# Patient Record
Sex: Female | Born: 1970 | Race: Black or African American | Hispanic: No | Marital: Single | Smoking: Former smoker
Health system: Southern US, Community
[De-identification: ages and names within clinical notes are randomized; demographics above are authoritative.]

## PROBLEM LIST (undated history)

## (undated) DIAGNOSIS — J40 Bronchitis, not specified as acute or chronic: Secondary | ICD-10-CM

## (undated) DIAGNOSIS — F419 Anxiety disorder, unspecified: Secondary | ICD-10-CM

## (undated) DIAGNOSIS — F329 Major depressive disorder, single episode, unspecified: Secondary | ICD-10-CM

## (undated) DIAGNOSIS — E785 Hyperlipidemia, unspecified: Secondary | ICD-10-CM

## (undated) DIAGNOSIS — F32A Depression, unspecified: Secondary | ICD-10-CM

## (undated) DIAGNOSIS — D573 Sickle-cell trait: Secondary | ICD-10-CM

## (undated) DIAGNOSIS — R51 Headache: Secondary | ICD-10-CM

## (undated) HISTORY — PX: WISDOM TOOTH EXTRACTION: SHX21

## (undated) HISTORY — PX: OTHER SURGICAL HISTORY: SHX169

## (undated) HISTORY — PX: TONSILLECTOMY: SUR1361

---

## 2003-11-10 ENCOUNTER — Inpatient Hospital Stay (HOSPITAL_COMMUNITY): Admission: AD | Admit: 2003-11-10 | Discharge: 2003-11-10 | Payer: Self-pay | Admitting: Obstetrics & Gynecology

## 2003-11-21 ENCOUNTER — Ambulatory Visit (HOSPITAL_COMMUNITY): Admission: RE | Admit: 2003-11-21 | Discharge: 2003-11-21 | Payer: Self-pay | Admitting: *Deleted

## 2003-12-18 ENCOUNTER — Ambulatory Visit (HOSPITAL_COMMUNITY): Admission: RE | Admit: 2003-12-18 | Discharge: 2003-12-18 | Payer: Self-pay | Admitting: *Deleted

## 2004-01-25 ENCOUNTER — Inpatient Hospital Stay (HOSPITAL_COMMUNITY): Admission: AD | Admit: 2004-01-25 | Discharge: 2004-01-25 | Payer: Self-pay | Admitting: Family Medicine

## 2004-04-16 ENCOUNTER — Inpatient Hospital Stay (HOSPITAL_COMMUNITY): Admission: AD | Admit: 2004-04-16 | Discharge: 2004-04-16 | Payer: Self-pay | Admitting: Obstetrics & Gynecology

## 2004-04-26 ENCOUNTER — Encounter: Admission: RE | Admit: 2004-04-26 | Discharge: 2004-04-26 | Payer: Self-pay | Admitting: Sports Medicine

## 2004-05-19 ENCOUNTER — Inpatient Hospital Stay (HOSPITAL_COMMUNITY): Admission: AD | Admit: 2004-05-19 | Discharge: 2004-05-19 | Payer: Self-pay | Admitting: Obstetrics and Gynecology

## 2004-05-24 ENCOUNTER — Encounter: Admission: RE | Admit: 2004-05-24 | Discharge: 2004-05-24 | Payer: Self-pay | Admitting: *Deleted

## 2004-05-25 ENCOUNTER — Ambulatory Visit: Payer: Self-pay | Admitting: *Deleted

## 2004-05-25 ENCOUNTER — Inpatient Hospital Stay (HOSPITAL_COMMUNITY): Admission: AD | Admit: 2004-05-25 | Discharge: 2004-05-28 | Payer: Self-pay | Admitting: *Deleted

## 2004-11-16 ENCOUNTER — Emergency Department (HOSPITAL_COMMUNITY): Admission: EM | Admit: 2004-11-16 | Discharge: 2004-11-16 | Payer: Self-pay | Admitting: Family Medicine

## 2005-08-19 ENCOUNTER — Ambulatory Visit (HOSPITAL_COMMUNITY): Admission: RE | Admit: 2005-08-19 | Discharge: 2005-08-19 | Payer: Self-pay | Admitting: *Deleted

## 2005-09-24 ENCOUNTER — Emergency Department (HOSPITAL_COMMUNITY): Admission: EM | Admit: 2005-09-24 | Discharge: 2005-09-24 | Payer: Self-pay | Admitting: Emergency Medicine

## 2005-12-21 ENCOUNTER — Emergency Department (HOSPITAL_COMMUNITY): Admission: EM | Admit: 2005-12-21 | Discharge: 2005-12-21 | Payer: Self-pay | Admitting: Family Medicine

## 2006-01-25 ENCOUNTER — Ambulatory Visit: Payer: Self-pay | Admitting: *Deleted

## 2006-01-25 ENCOUNTER — Encounter (INDEPENDENT_AMBULATORY_CARE_PROVIDER_SITE_OTHER): Payer: Self-pay | Admitting: *Deleted

## 2006-02-15 ENCOUNTER — Emergency Department (HOSPITAL_COMMUNITY): Admission: EM | Admit: 2006-02-15 | Discharge: 2006-02-15 | Payer: Self-pay | Admitting: Family Medicine

## 2006-04-30 ENCOUNTER — Emergency Department (HOSPITAL_COMMUNITY): Admission: EM | Admit: 2006-04-30 | Discharge: 2006-04-30 | Payer: Self-pay | Admitting: Emergency Medicine

## 2006-07-25 ENCOUNTER — Emergency Department (HOSPITAL_COMMUNITY): Admission: EM | Admit: 2006-07-25 | Discharge: 2006-07-25 | Payer: Self-pay | Admitting: Family Medicine

## 2006-08-05 ENCOUNTER — Emergency Department (HOSPITAL_COMMUNITY): Admission: EM | Admit: 2006-08-05 | Discharge: 2006-08-05 | Payer: Self-pay | Admitting: Family Medicine

## 2007-08-23 ENCOUNTER — Emergency Department (HOSPITAL_COMMUNITY): Admission: EM | Admit: 2007-08-23 | Discharge: 2007-08-23 | Payer: Self-pay | Admitting: Family Medicine

## 2008-11-20 ENCOUNTER — Emergency Department (HOSPITAL_COMMUNITY): Admission: EM | Admit: 2008-11-20 | Discharge: 2008-11-20 | Payer: Self-pay | Admitting: Family Medicine

## 2008-12-24 ENCOUNTER — Emergency Department (HOSPITAL_COMMUNITY): Admission: EM | Admit: 2008-12-24 | Discharge: 2008-12-24 | Payer: Self-pay | Admitting: Emergency Medicine

## 2009-02-24 ENCOUNTER — Inpatient Hospital Stay (HOSPITAL_COMMUNITY): Admission: AD | Admit: 2009-02-24 | Discharge: 2009-02-24 | Payer: Self-pay | Admitting: Obstetrics & Gynecology

## 2009-05-19 ENCOUNTER — Ambulatory Visit (HOSPITAL_COMMUNITY): Admission: RE | Admit: 2009-05-19 | Discharge: 2009-05-19 | Payer: Self-pay | Admitting: Obstetrics

## 2009-06-30 ENCOUNTER — Ambulatory Visit (HOSPITAL_COMMUNITY): Admission: RE | Admit: 2009-06-30 | Discharge: 2009-06-30 | Payer: Self-pay | Admitting: Obstetrics

## 2009-10-12 ENCOUNTER — Inpatient Hospital Stay (HOSPITAL_COMMUNITY): Admission: AD | Admit: 2009-10-12 | Discharge: 2009-10-14 | Payer: Self-pay | Admitting: Obstetrics & Gynecology

## 2009-11-27 ENCOUNTER — Emergency Department (HOSPITAL_COMMUNITY): Admission: EM | Admit: 2009-11-27 | Discharge: 2009-11-27 | Payer: Self-pay | Admitting: Emergency Medicine

## 2010-09-03 ENCOUNTER — Ambulatory Visit (HOSPITAL_COMMUNITY): Admission: RE | Admit: 2010-09-03 | Discharge: 2010-09-03 | Payer: Self-pay | Admitting: Obstetrics

## 2010-11-17 ENCOUNTER — Other Ambulatory Visit: Payer: Self-pay | Admitting: Obstetrics

## 2010-11-17 DIAGNOSIS — O09529 Supervision of elderly multigravida, unspecified trimester: Secondary | ICD-10-CM

## 2010-11-26 ENCOUNTER — Other Ambulatory Visit (HOSPITAL_COMMUNITY): Payer: Self-pay

## 2010-11-26 ENCOUNTER — Encounter (HOSPITAL_COMMUNITY): Payer: Self-pay

## 2010-11-30 ENCOUNTER — Ambulatory Visit (HOSPITAL_COMMUNITY)
Admission: RE | Admit: 2010-11-30 | Discharge: 2010-11-30 | Disposition: A | Discharge status: Home / Self Care | Payer: Medicaid Other | Source: Ambulatory Visit | Attending: Obstetrics | Admitting: Obstetrics

## 2010-11-30 ENCOUNTER — Other Ambulatory Visit (HOSPITAL_COMMUNITY): Payer: Self-pay

## 2010-11-30 ENCOUNTER — Ambulatory Visit (HOSPITAL_COMMUNITY): Admission: RE | Admit: 2010-11-30 | Payer: Medicaid Other | Source: Ambulatory Visit

## 2010-11-30 DIAGNOSIS — O09529 Supervision of elderly multigravida, unspecified trimester: Secondary | ICD-10-CM | POA: Insufficient documentation

## 2010-11-30 DIAGNOSIS — Z1389 Encounter for screening for other disorder: Secondary | ICD-10-CM | POA: Insufficient documentation

## 2010-11-30 DIAGNOSIS — O358XX0 Maternal care for other (suspected) fetal abnormality and damage, not applicable or unspecified: Secondary | ICD-10-CM | POA: Insufficient documentation

## 2010-11-30 DIAGNOSIS — Z363 Encounter for antenatal screening for malformations: Secondary | ICD-10-CM | POA: Insufficient documentation

## 2010-12-19 LAB — CBC
Hemoglobin: 9.8 g/dL — ABNORMAL LOW (ref 12.0–15.0)
Platelets: 220 10*3/uL (ref 150–400)
RBC: 3.01 MIL/uL — ABNORMAL LOW (ref 3.87–5.11)
RDW: 14.8 % (ref 11.5–15.5)
WBC: 12.1 10*3/uL — ABNORMAL HIGH (ref 4.0–10.5)

## 2010-12-19 LAB — RPR: RPR Ser Ql: NONREACTIVE

## 2011-01-11 LAB — URINALYSIS, ROUTINE W REFLEX MICROSCOPIC
Glucose, UA: NEGATIVE mg/dL
Hgb urine dipstick: NEGATIVE
Specific Gravity, Urine: 1.025 (ref 1.005–1.030)
Urobilinogen, UA: 0.2 mg/dL (ref 0.0–1.0)

## 2011-01-11 LAB — GC/CHLAMYDIA PROBE AMP, GENITAL: Chlamydia, DNA Probe: NEGATIVE

## 2011-01-11 LAB — ABO/RH: ABO/RH(D): A POS

## 2011-01-11 LAB — CBC
MCV: 94.7 fL (ref 78.0–100.0)
RBC: 3.6 MIL/uL — ABNORMAL LOW (ref 3.87–5.11)
WBC: 6.8 10*3/uL (ref 4.0–10.5)

## 2011-01-11 LAB — HCG, QUANTITATIVE, PREGNANCY: hCG, Beta Chain, Quant, S: 85644 m[IU]/mL — ABNORMAL HIGH (ref <5)

## 2011-01-11 LAB — WET PREP, GENITAL: Trich, Wet Prep: NONE SEEN

## 2011-01-13 LAB — POCT URINALYSIS DIP (DEVICE)
Protein, ur: 30 mg/dL — AB
Specific Gravity, Urine: 1.015 (ref 1.005–1.030)
Urobilinogen, UA: 0.2 mg/dL (ref 0.0–1.0)
pH: 5.5 (ref 5.0–8.0)

## 2011-02-18 NOTE — Group Therapy Note (Signed)
NAME:  Shelia Harrison, Shelia Harrison NO.:  [id number]   MEDICAL RECORD NO.:  [id number]          PATIENT TYPE:  WOC   LOCATION:  [facility]                   FACILITY:  [facility]   PHYSICIAN:  Ellis Parents, MD    DATE OF BIRTH:  1970-11-09   DATE OF SERVICE:  01/25/2006                                    CLINIC NOTE   This 40 year old, gravida 2, para 2, last menstrual period January 12, 2006  comes in for contraceptive management. The last menstrual period was scanty  lasting 1-2 days but very minimal blood flow. The previous menstrual period  was early March, the patient has no symptoms of pregnancy. The patient has  been on OC's for 3 years up until October of 2006 when she discontinued the  birth control pills on the advice of family practice physician because of  aching of her thighs. An ultrasound was done which did not identify any deep  venous thrombosis. The patient's periods have been regular. The patient does  not smoke.   PHYSICAL EXAMINATION:  Vagina is clean. The cervix is well epithelized, the  uterus is in mid position but cannot be actively assessed because of the  patient's weight. No pelvic masses are palpable.   Urine pregnancy test was negative. The patient was given a prescription for  Cathren Harsh low for 1 year supply. She is to return in one year.           ______________________________  Ellis Parents, MD     SA/MEDQ  D:  01/25/2006  T:  01/26/2006  Job:  [id number]

## 2011-03-30 ENCOUNTER — Inpatient Hospital Stay (HOSPITAL_COMMUNITY)
Admission: AD | Admit: 2011-03-30 | Discharge: 2011-03-31 | DRG: 780 | Disposition: A | Discharge status: Home / Self Care | Payer: Medicaid Other | Source: Ambulatory Visit | Attending: Obstetrics & Gynecology | Admitting: Obstetrics & Gynecology

## 2011-03-30 DIAGNOSIS — R109 Unspecified abdominal pain: Secondary | ICD-10-CM | POA: Diagnosis present

## 2011-03-30 DIAGNOSIS — O479 False labor, unspecified: Principal | ICD-10-CM | POA: Diagnosis present

## 2011-03-30 LAB — CBC
MCH: 32.4 pg (ref 26.0–34.0)
MCHC: 35.4 g/dL (ref 30.0–36.0)
MCV: 91.5 fL (ref 78.0–100.0)
Platelets: 216 10*3/uL (ref 150–400)
RDW: 14.1 % (ref 11.5–15.5)

## 2011-04-05 ENCOUNTER — Inpatient Hospital Stay (HOSPITAL_COMMUNITY): Payer: Medicaid Other | Admitting: Obstetrics & Gynecology

## 2011-04-05 ENCOUNTER — Inpatient Hospital Stay (HOSPITAL_COMMUNITY)
Admission: AD | Admit: 2011-04-05 | Discharge: 2011-04-07 | DRG: 775 | Disposition: A | Discharge status: Home / Self Care | Payer: Medicaid Other | Source: Ambulatory Visit | Attending: Obstetrics & Gynecology | Admitting: Obstetrics & Gynecology

## 2011-04-05 DIAGNOSIS — Z22330 Carrier of Group B streptococcus: Secondary | ICD-10-CM

## 2011-04-05 DIAGNOSIS — O99892 Other specified diseases and conditions complicating childbirth: Secondary | ICD-10-CM | POA: Diagnosis present

## 2011-04-05 DIAGNOSIS — O09529 Supervision of elderly multigravida, unspecified trimester: Secondary | ICD-10-CM | POA: Diagnosis present

## 2011-04-05 LAB — CBC
Hemoglobin: 11.2 g/dL — ABNORMAL LOW (ref 12.0–15.0)
MCH: 32.1 pg (ref 26.0–34.0)
MCV: 91.1 fL (ref 78.0–100.0)
Platelets: 198 10*3/uL (ref 150–400)
RBC: 3.49 MIL/uL — ABNORMAL LOW (ref 3.87–5.11)
WBC: 14 10*3/uL — ABNORMAL HIGH (ref 4.0–10.5)

## 2011-04-05 LAB — RPR: RPR Ser Ql: NONREACTIVE

## 2011-04-06 LAB — CBC
HCT: 29.4 % — ABNORMAL LOW (ref 36.0–46.0)
Hemoglobin: 10.2 g/dL — ABNORMAL LOW (ref 12.0–15.0)
MCHC: 34.7 g/dL (ref 30.0–36.0)
RBC: 3.2 MIL/uL — ABNORMAL LOW (ref 3.87–5.11)

## 2011-04-09 NOTE — Discharge Summary (Signed)
Shelia Harrison, BELMARES NO.:  [id number]  MEDICAL RECORD NO.:  [id number]  LOCATION:  9169                          FACILITY:  [facility]  PHYSICIAN:  Juanitta Earnhardt A. Clearance Coots, M.D.DATE OF BIRTH:  Oct 12, 1970  DATE OF ADMISSION:  03/30/2011 DATE OF DISCHARGE:  03/31/2011                              DISCHARGE SUMMARY   ADMITTING DIAGNOSES: 1. Multipara at [redacted] weeks gestation. 2. Latent phase of labor. 3. Rule out labor.  DISCHARGE DIAGNOSES: 1. Multipara at [redacted] weeks gestation. 2. Latent phase of labor. 3. Rule out labor.  DISCHARGE HOME:  Undelivered at [redacted] weeks gestation in good condition, not in labor.  REASON FOR ADMISSION:  A 40 year old para 3, estimated date of confinement of April 10, 2011, presents with uterine contractions.  PAST MEDICAL HISTORY:  Please see chart for full historical data.  PHYSICAL EXAMINATION:  GENERAL:  Well-nourished and well-developed female, in no acute distress. VITAL SIGNS:  Afebrile.  Stable. LUNGS:  Clear to auscultation bilaterally. HEART:  Regular rate and rhythm. ABDOMEN:  Gravid, nontender.  Cervix per nurses' exam was 5 cm and 80% effaced, vertex at a -3 station.  ADMITTING LABORATORY DATA:  Hemoglobin 12.5, hematocrit 35, white blood cell count 17,100, platelets 116,000.  RPR was nonreactive.  Group B strep was positive.  HOSPITAL COURSE:  The patient was admitted to Labor and Delivery for rule out labor.  She received pain medication during the course of her uterine contractions and her contractions ceased.  She was discharged home the following morning with occasional mild uterine contractions. Cervix was unchanged.  The patient was discharged home with instructions for labor.  DISCHARGE DISPOSITION:  Medications:  Continue prenatal vitamins. Routine instructions were given for labor.  The patient is to keep regular scheduled appointment in the office.     Erin Obando A. Clearance Coots, M.D.     CAH/MEDQ  D:  04/08/2011  T:   04/09/2011  Job:  [id number]

## 2011-06-23 ENCOUNTER — Encounter (HOSPITAL_COMMUNITY): Payer: Self-pay | Admitting: *Deleted

## 2011-06-23 ENCOUNTER — Other Ambulatory Visit: Payer: Self-pay | Admitting: Obstetrics & Gynecology

## 2011-07-08 ENCOUNTER — Encounter (HOSPITAL_COMMUNITY)
Admission: RE | Disposition: A | Discharge status: Home / Self Care | Payer: Self-pay | Source: Ambulatory Visit | Attending: Obstetrics & Gynecology

## 2011-07-08 ENCOUNTER — Encounter (HOSPITAL_COMMUNITY): Payer: Self-pay | Admitting: Certified Registered"

## 2011-07-08 ENCOUNTER — Encounter (HOSPITAL_COMMUNITY): Payer: Self-pay | Admitting: Obstetrics & Gynecology

## 2011-07-08 ENCOUNTER — Ambulatory Visit (HOSPITAL_COMMUNITY)
Admission: RE | Admit: 2011-07-08 | Discharge: 2011-07-08 | Disposition: A | Discharge status: Home / Self Care | Payer: Medicaid Other | Source: Ambulatory Visit | Attending: Obstetrics & Gynecology | Admitting: Obstetrics & Gynecology

## 2011-07-08 ENCOUNTER — Ambulatory Visit (HOSPITAL_COMMUNITY): Payer: Medicaid Other | Admitting: Certified Registered"

## 2011-07-08 ENCOUNTER — Encounter (HOSPITAL_COMMUNITY): Payer: Self-pay | Admitting: *Deleted

## 2011-07-08 DIAGNOSIS — Z302 Encounter for sterilization: Secondary | ICD-10-CM | POA: Insufficient documentation

## 2011-07-08 DIAGNOSIS — Z309 Encounter for contraceptive management, unspecified: Secondary | ICD-10-CM

## 2011-07-08 DIAGNOSIS — Z5320 Procedure and treatment not carried out because of patient's decision for unspecified reasons: Secondary | ICD-10-CM | POA: Insufficient documentation

## 2011-07-08 HISTORY — DX: Major depressive disorder, single episode, unspecified: F32.9

## 2011-07-08 HISTORY — DX: Bronchitis, not specified as acute or chronic: J40

## 2011-07-08 HISTORY — DX: Anxiety disorder, unspecified: F41.9

## 2011-07-08 HISTORY — DX: Hyperlipidemia, unspecified: E78.5

## 2011-07-08 HISTORY — DX: Depression, unspecified: F32.A

## 2011-07-08 HISTORY — DX: Headache: R51

## 2011-07-08 LAB — CBC
HCT: 35.1 % — ABNORMAL LOW (ref 36.0–46.0)
MCH: 31 pg (ref 26.0–34.0)
MCV: 89.1 fL (ref 78.0–100.0)
Platelets: 269 10*3/uL (ref 150–400)
RBC: 3.94 MIL/uL (ref 3.87–5.11)
WBC: 5.2 10*3/uL (ref 4.0–10.5)

## 2011-07-08 SURGERY — ESSURE TUBAL STERILIZATION

## 2011-07-08 MED ORDER — GLYCOPYRROLATE 0.2 MG/ML IJ SOLN
INTRAMUSCULAR | Status: AC
  Filled 2011-07-08: qty 1

## 2011-07-08 MED ORDER — LACTATED RINGERS IV SOLN
INTRAVENOUS | Status: DC
  Administered 2011-07-08: 14:00:00 via INTRAVENOUS

## 2011-07-08 MED ORDER — FENTANYL CITRATE 0.05 MG/ML IJ SOLN
INTRAMUSCULAR | Status: AC
  Filled 2011-07-08: qty 2

## 2011-07-08 MED ORDER — ONDANSETRON HCL 4 MG/2ML IJ SOLN
INTRAMUSCULAR | Status: AC
  Filled 2011-07-08: qty 2

## 2011-07-08 MED ORDER — FAMOTIDINE 20 MG PO TABS: 20.0000 mg | ORAL_TABLET | Freq: Once | ORAL | Status: DC

## 2011-07-08 MED ORDER — LIDOCAINE HCL (CARDIAC) 20 MG/ML IV SOLN
INTRAVENOUS | Status: AC
  Filled 2011-07-08: qty 5

## 2011-07-08 MED ORDER — MIDAZOLAM HCL 5 MG/5ML IJ SOLN
INTRAMUSCULAR | Status: DC | PRN
  Administered 2011-07-08: 2 mg via INTRAVENOUS

## 2011-07-08 MED ORDER — MIDAZOLAM HCL 2 MG/2ML IJ SOLN
INTRAMUSCULAR | Status: AC
  Filled 2011-07-08: qty 2

## 2011-07-08 MED ORDER — PANTOPRAZOLE SODIUM 40 MG PO TBEC: 40.0000 mg | DELAYED_RELEASE_TABLET | Freq: Once | ORAL | Status: DC

## 2011-07-08 MED ORDER — PROPOFOL 10 MG/ML IV EMUL
INTRAVENOUS | Status: AC
  Filled 2011-07-08: qty 20

## 2011-07-08 MED ORDER — SCOPOLAMINE 1 MG/3DAYS TD PT72: 1.0000 | MEDICATED_PATCH | Freq: Once | TRANSDERMAL | Status: DC

## 2011-07-08 MED ORDER — METOCLOPRAMIDE HCL 10 MG PO TABS: 10.0000 mg | ORAL_TABLET | Freq: Once | ORAL | Status: DC

## 2011-07-08 MED ORDER — SODIUM CHLORIDE 0.9 % IR SOLN: Status: DC | PRN

## 2011-07-08 MED ORDER — DEXAMETHASONE SODIUM PHOSPHATE 10 MG/ML IJ SOLN
INTRAMUSCULAR | Status: AC
  Filled 2011-07-08: qty 1

## 2011-07-08 MED ORDER — CITRIC ACID-SODIUM CITRATE 334-500 MG/5ML PO SOLN: 30.0000 mL | Freq: Once | ORAL | Status: DC

## 2011-07-08 SURGICAL SUPPLY — 9 items
CATH ROBINSON RED A/P 16FR (CATHETERS) ×3 IMPLANT
CLOTH BEACON ORANGE TIMEOUT ST (SAFETY) ×3 IMPLANT
CONTAINER PREFILL 10% NBF 60ML (FORM) IMPLANT
DRAPE UTILITY XL STRL (DRAPES) ×3 IMPLANT
GLOVE BIO SURGEON STRL SZ 6.5 (GLOVE) ×6 IMPLANT
GOWN PREVENTION PLUS LG XLONG (DISPOSABLE) ×6 IMPLANT
PACK HYSTEROSCOPY LF (CUSTOM PROCEDURE TRAY) ×3 IMPLANT
TOWEL OR 17X24 6PK STRL BLUE (TOWEL DISPOSABLE) ×6 IMPLANT
WATER STERILE IRR 1000ML POUR (IV SOLUTION) ×3 IMPLANT

## 2011-07-08 NOTE — H&P (Signed)
  Chief Complaint: 40 y.o. who presents for an Essure procedure  Details of Present Illness: See above.  LMP 06/19/2011  Past Medical History  Diagnosis Date  . Hyperlipidemia     no meds  . Bronchitis   . Headache   . Anxiety     no meds  . Depression     no meds   History   Social History  . Marital Status: Single    Spouse Name: N/A    Number of Children: N/A  . Years of Education: N/A   Occupational History  . Not on file.   Social History Main Topics  . Smoking status: Former Smoker    Types: Cigars    Quit date: 10/03/2008  . Smokeless tobacco: Never Used  . Alcohol Use: 0.5 oz/week    1 drink(s) per week     occasionally  . Drug Use: No  . Sexually Active: Not Currently   Other Topics Concern  . Not on file   Social History Narrative  . No narrative on file   History reviewed. No pertinent family history.  Pertinent items are noted in HPI.  Pre-Op Diagnosis: desires sterilization   Planned Procedure: Procedure(s): ESSURE TUBAL STERILIZATION  I have reviewed the patient's history and have completed the physical exam and Shelia Harrison is acceptable for surgery.  Roseanna Rainbow, MD 07/08/2011 9:23 AM

## 2011-07-08 NOTE — Anesthesia Postprocedure Evaluation (Signed)
  Anesthesia Post-op Note  Patient: Shelia Harrison  Procedure(s) Performed:  ESSURE TUBAL STERILIZATION  Patient decided to cancel surgery after she was taken to the OR. She had been given Versed 2mg  IV. This was the only medication administered in the OR. She was taken to the PACU.   Post assessment: Post-op Vital signs reviewed  Last Vitals:  Filed Vitals:   07/08/11 1455  BP:   Pulse: 71  Temp:   Resp: 16    Post vital signs: Reviewed and stable  Level of consciousness: sedated   Complications: No apparent anesthesia complications

## 2011-07-08 NOTE — OR Nursing (Signed)
Pt transferred to OR room #8 assisted on to OR table, Pt then stated,  " I changed my mind, I don't want to have procedure done" Dr. Tamela Oddi notified and called to room to speak with patient. Surgery canceled as per Tamela Oddi MD. Pt transfer to PACU for monitoring

## 2011-07-08 NOTE — Brief Op Note (Signed)
07/08/2011  2:00 PM  PATIENT:  Shelia Harrison  39 y.o. female  PRE-OPERATIVE DIAGNOSIS:  desires sterilization  POST-OPERATIVE DIAGNOSIS:  Pt changed her mind.  PROCEDURE:  Procedure(s): ESSURE TUBAL STERILIZATION  SURGEON:  Surgeon(s): Roseanna Rainbow, MD     ANESTHESIA:   IV sedation  OR FLUID I/O:  Total I/O In: 200 [I.V.:200] Out: -   BLOOD ADMINISTERED:none  DRAINS: none   LOCAL MEDICATIONS USED:  NONE  SPECIMEN:  No Specimen  DISPOSITION OF SPECIMEN:  N/A     PLAN OF CARE: Discharge to home after PACU  PATIENT DISPOSITION:  PACU - hemodynamically stable.   The procedure was aborted per pt request.

## 2011-07-08 NOTE — Anesthesia Preprocedure Evaluation (Signed)
Anesthesia Evaluation  Name, MR# and DOB Patient awake  General Assessment Comment  Reviewed: Allergy & Precautions, H&P , NPO status , Patient's Chart, lab work & pertinent test results  Airway Mallampati: II TM Distance: >3 FB Neck ROM: full    Dental No notable dental hx. (+) Teeth Intact   Pulmonary  clear to auscultation  Pulmonary exam normal       Cardiovascular regular Normal    Neuro/Psych Negative Neurological ROS  Negative Psych ROS   GI/Hepatic negative GI ROS Neg liver ROS    Endo/Other  Negative Endocrine ROS  Renal/GU negative Renal ROS  Genitourinary negative   Musculoskeletal   Abdominal   Peds  Hematology negative hematology ROS (+)   Anesthesia Other Findings   Reproductive/Obstetrics negative OB ROS                           Anesthesia Physical Anesthesia Plan  ASA: II  Anesthesia Plan: General   Post-op Pain Management:    Induction:   Airway Management Planned:   Additional Equipment:   Intra-op Plan:   Post-operative Plan:   Informed Consent: I have reviewed the patients History and Physical, chart, labs and discussed the procedure including the risks, benefits and alternatives for the proposed anesthesia with the patient or authorized representative who has indicated his/her understanding and acceptance.   Dental Advisory Given  Plan Discussed with: Anesthesiologist  Anesthesia Plan Comments:         Anesthesia Quick Evaluation

## 2011-07-08 NOTE — Transfer of Care (Signed)
Immediate Anesthesia Transfer of Care Note  Patient: Shelia Harrison  Procedure(s) Performed:  ESSURE TUBAL STERILIZATION  Patient Location: PACU  Anesthesia Type: General  Level of Consciousness: awake, alert  and oriented  Airway & Oxygen Therapy: Patient Spontanous Breathing  Post-op Assessment: Report given to PACU RN and Post -op Vital signs reviewed and stable  Post vital signs: Reviewed and stable  Complications: No apparent anesthesia complications

## 2011-07-12 LAB — URINE CULTURE: Colony Count: >=100000

## 2011-07-12 LAB — POCT URINALYSIS DIP (DEVICE)
Bilirubin Urine: NEGATIVE
Ketones, ur: 15 — AB
Operator id: 239701
Protein, ur: 30 — AB
Specific Gravity, Urine: 1.015

## 2012-11-05 ENCOUNTER — Encounter (HOSPITAL_COMMUNITY): Payer: Self-pay

## 2012-11-05 ENCOUNTER — Emergency Department (INDEPENDENT_AMBULATORY_CARE_PROVIDER_SITE_OTHER)
Admission: EM | Admit: 2012-11-05 | Discharge: 2012-11-05 | Disposition: A | Discharge status: Home / Self Care | Payer: Medicaid Other | Source: Home / Self Care | Attending: Family Medicine | Admitting: Family Medicine

## 2012-11-05 DIAGNOSIS — Z041 Encounter for examination and observation following transport accident: Secondary | ICD-10-CM

## 2012-11-05 DIAGNOSIS — M549 Dorsalgia, unspecified: Secondary | ICD-10-CM

## 2012-11-05 DIAGNOSIS — M542 Cervicalgia: Secondary | ICD-10-CM

## 2012-11-05 HISTORY — DX: Sickle-cell trait: D57.3

## 2012-11-05 MED ORDER — CYCLOBENZAPRINE HCL 5 MG PO TABS: 5.0000 mg | ORAL_TABLET | Freq: Three times a day (TID) | ORAL | Status: DC | PRN

## 2012-11-05 MED ORDER — IBUPROFEN 800 MG PO TABS: 800.0000 mg | ORAL_TABLET | Freq: Three times a day (TID) | ORAL | Status: DC
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
## 2013-04-24 ENCOUNTER — Other Ambulatory Visit: Payer: Self-pay | Admitting: Family Medicine

## 2013-04-24 DIAGNOSIS — Z1231 Encounter for screening mammogram for malignant neoplasm of breast: Secondary | ICD-10-CM

## 2013-05-23 ENCOUNTER — Ambulatory Visit: Payer: Medicaid Other

## 2013-06-18 ENCOUNTER — Ambulatory Visit
Admission: RE | Admit: 2013-06-18 | Discharge: 2013-06-18 | Disposition: A | Discharge status: Home / Self Care | Payer: Medicaid Other | Source: Ambulatory Visit | Attending: Family Medicine | Admitting: Family Medicine

## 2013-06-18 DIAGNOSIS — Z1231 Encounter for screening mammogram for malignant neoplasm of breast: Secondary | ICD-10-CM

## 2014-01-10 ENCOUNTER — Emergency Department (HOSPITAL_COMMUNITY): Admission: EM | Admit: 2014-01-10 | Discharge: 2014-01-10 | Payer: Self-pay

## 2014-01-10 ENCOUNTER — Encounter (HOSPITAL_COMMUNITY): Payer: Self-pay | Admitting: Emergency Medicine

## 2014-01-10 ENCOUNTER — Emergency Department (INDEPENDENT_AMBULATORY_CARE_PROVIDER_SITE_OTHER)
Admission: EM | Admit: 2014-01-10 | Discharge: 2014-01-10 | Disposition: A | Discharge status: Home / Self Care | Payer: Medicaid Other | Source: Home / Self Care | Attending: Family Medicine | Admitting: Family Medicine

## 2014-01-10 DIAGNOSIS — IMO0002 Reserved for concepts with insufficient information to code with codable children: Secondary | ICD-10-CM

## 2014-01-10 DIAGNOSIS — T148XXA Other injury of unspecified body region, initial encounter: Secondary | ICD-10-CM

## 2014-01-10 NOTE — ED Notes (Signed)
Pt states that while bending over at work hit left side of head on corner of shelf.  C/o tenderness to touch.  Mild swelling.   Incident happened last night.

## 2014-01-10 NOTE — ED Provider Notes (Signed)
Medical screening examination/treatment/procedure(s) were performed by resident physician or non-physician practitioner and as supervising physician I was immediately available for consultation/collaboration.   Kennetha Pearman DOUGLAS MD.   Taliyah Watrous D Burle Kwan, MD 01/10/14 1301 

## 2014-01-10 NOTE — Discharge Instructions (Signed)
Use warm compresses several times a day to help reduce the swelling and pain. Use ibuprofen or Aleve as directed on the package to help with the pain.    Contusion A contusion is a deep bruise. Contusions happen when an injury causes bleeding under the skin. Signs of bruising include pain, puffiness (swelling), and discolored skin. The contusion may turn blue, purple, or yellow. HOME CARE   Put ice on the injured area.  Put ice in a plastic bag.  Place a towel between your skin and the bag.  Leave the ice on for 15-20 minutes, 03-04 times a day.  Only take medicine as told by your doctor.  Rest the injured area.  If possible, raise (elevate) the injured area to lessen puffiness. GET HELP RIGHT AWAY IF:   You have more bruising or puffiness.  You have pain that is getting worse.  Your puffiness or pain is not helped by medicine. MAKE SURE YOU:   Understand these instructions.  Will watch your condition.  Will get help right away if you are not doing well or get worse. Document Released: 03/07/2008 Document Revised: 12/12/2011 Document Reviewed: 07/25/2011 [facility] Patient Information 2014 [place], [place].

## 2014-01-10 NOTE — ED Provider Notes (Signed)
CSN: [id number]     Arrival date & time 01/10/14  [id number] History   First MD Initiated Contact with Patient 01/10/14 1031     Chief Complaint  Patient presents with  . Head Injury   (Consider location/radiation/quality/duration/timing/severity/associated sxs/prior Treatment) HPI Comments: Injury didn't bother pt until yesterday  Patient is a 43 y.o. female presenting with head injury. The history is provided by the patient.  Head Injury Location:  L parietal Time since incident:  2 days Mechanism of injury comment:  When standing up after bending over, hit head on corner of steel shelf Pain details:    Quality:  Aching   Severity:  Mild   Duration:  2 days   Timing:  Constant   Progression:  Improving Chronicity:  New Relieved by:  None tried Worsened by:  Pressure Ineffective treatments:  None tried Associated symptoms: blurred vision   Associated symptoms: no headaches and no loss of consciousness     Past Medical History  Diagnosis Date  . Hyperlipidemia     no meds  . Bronchitis   . Headache(784.0)   . Anxiety     no meds  . Depression     no meds  . Sickle cell trait    Past Surgical History  Procedure Laterality Date  . Svd       x 4  . Tonsillectomy    . Wisdom tooth extraction    . Hyperlipidemia      no meds   History reviewed. No pertinent family history. History  Substance Use Topics  . Smoking status: Former Smoker    Types: Cigars    Quit date: 10/03/2008  . Smokeless tobacco: Never Used  . Alcohol Use: 0.5 oz/week    1 drink(s) per week     Comment: occasionally   OB History   Grav Para Term Preterm Abortions TAB SAB Ect Mult Living                 Review of Systems  HENT:       Minor head injury  Eyes: Positive for blurred vision.  Skin: Negative for wound.  Neurological: Negative for loss of consciousness and headaches.  Psychiatric/Behavioral: Negative for confusion.    Allergies  Review of patient's allergies indicates no  known allergies.  Home Medications   Current Outpatient Rx  Name  Route  Sig  Dispense  Refill  . cyclobenzaprine (FLEXERIL) 5 MG tablet   Oral   Take 1 tablet (5 mg total) by mouth 3 (three) times daily as needed for muscle spasms.   30 tablet   0   . HYDROcodone-acetaminophen (VICODIN ES) 7.5-750 MG per tablet   Oral   Take 0.5 tablets by mouth every 6 (six) hours as needed. Patient takes for pain          . ibuprofen (ADVIL,MOTRIN) 800 MG tablet   Oral   Take 1 tablet (800 mg total) by mouth 3 (three) times daily.   30 tablet   0   . prenatal vitamin w/FE, FA (PRENATAL 1 + 1) 27-1 MG TABS   Oral   Take 1 tablet by mouth daily.            BP 127/85  Pulse 67  Temp(Src) 98.4 F (36.9 C) (Oral)  Resp 18  SpO2 100%  LMP 01/09/2014 Physical Exam  Constitutional: She is oriented to person, place, and time. She appears well-developed and well-nourished. No distress.  Pulmonary/Chest: Effort normal.  Neurological:  She is alert and oriented to person, place, and time.  Skin: Skin is warm, dry and intact. No bruising noted. No erythema.  Small 1cm nodule L parietal scalp is tender to palp    ED Course  Procedures (including critical care time) Labs Review Labs Reviewed - No data to display Imaging Review No results found.   MDM   1. Contusion   very minor head injury with assoc soft tissue swelling. Suggested pt use warm compresses and ibuprofen.      Cathlyn Parsons, NP 01/10/14 1039

## 2014-03-14 ENCOUNTER — Encounter (HOSPITAL_COMMUNITY): Payer: Self-pay | Admitting: Emergency Medicine

## 2014-03-14 ENCOUNTER — Emergency Department (INDEPENDENT_AMBULATORY_CARE_PROVIDER_SITE_OTHER)
Admission: EM | Admit: 2014-03-14 | Discharge: 2014-03-14 | Disposition: A | Discharge status: Home / Self Care | Payer: Medicaid Other | Source: Home / Self Care

## 2014-03-14 DIAGNOSIS — A084 Viral intestinal infection, unspecified: Secondary | ICD-10-CM

## 2014-03-14 DIAGNOSIS — A088 Other specified intestinal infections: Secondary | ICD-10-CM

## 2014-03-14 MED ORDER — ONDANSETRON 4 MG PO TBDP: 4.0000 mg | ORAL_TABLET | Freq: Three times a day (TID) | ORAL | Status: DC | PRN

## 2014-03-14 NOTE — ED Notes (Signed)
Patient complains of pain in middle to lower abdomin at and around "belly button"; states pain started this morning; states some nausea denies vomiting.

## 2014-03-14 NOTE — ED Provider Notes (Signed)
CSN: [id number]     Arrival date & time 03/14/14  1232 History   None    Chief Complaint  Patient presents with  . Abdominal Pain   (Consider location/radiation/quality/duration/timing/severity/associated sxs/prior Treatment) Patient is a 43 y.o. female presenting with abdominal pain.  Abdominal Pain   Abd pain: onset this am. Getting worse. Sharp. Near the umbilicus. Comes and goes. Feeling lightehaded and nauseaus. Denies fevers, hematochezia, hematemes, HA, dysuria, frequency, CP, palpitations, diarrhea. Soft BMs BID. Daughter w/ similar symptoms 7 days ago. Sexually active. Started menstrual period today.    Past Medical History  Diagnosis Date  . Hyperlipidemia     no meds  . Bronchitis   . Headache(784.0)   . Anxiety     no meds  . Depression     no meds  . Sickle cell trait    Past Surgical History  Procedure Laterality Date  . Svd       x 4  . Tonsillectomy    . Wisdom tooth extraction    . Hyperlipidemia      no meds   No family history on file. History  Substance Use Topics  . Smoking status: Former Smoker    Types: Cigars    Quit date: 10/03/2008  . Smokeless tobacco: Never Used  . Alcohol Use: 0.5 oz/week    1 drink(s) per week     Comment: occasionally   OB History   Grav Para Term Preterm Abortions TAB SAB Ect Mult Living                 Review of Systems  Gastrointestinal: Positive for abdominal pain.   Per hpi w/ all other systems negative Allergies  Review of patient's allergies indicates no known allergies.  Home Medications   Prior to Admission medications   Medication Sig Start Date End Date Taking? Authorizing Provider  norgestimate-ethinyl estradiol (ORTHO-CYCLEN,SPRINTEC,PREVIFEM) 0.25-35 MG-MCG tablet Take 1 tablet by mouth daily.   Yes Historical Provider, MD  cyclobenzaprine (FLEXERIL) 5 MG tablet Take 1 tablet (5 mg total) by mouth 3 (three) times daily as needed for muscle spasms. 11/05/12   Linna HoffJames D Kindl, MD   HYDROcodone-acetaminophen (VICODIN ES) 7.5-750 MG per tablet Take 0.5 tablets by mouth every 6 (six) hours as needed. Patient takes for pain     Historical Provider, MD  ibuprofen (ADVIL,MOTRIN) 800 MG tablet Take 1 tablet (800 mg total) by mouth 3 (three) times daily. 11/05/12   Linna HoffJames D Kindl, MD  ondansetron (ZOFRAN-ODT) 4 MG disintegrating tablet Take 1 tablet (4 mg total) by mouth every 8 (eight) hours as needed for nausea or vomiting. 03/14/14   Ozella Rocksavid J Annalyssa Thune, MD  prenatal vitamin w/FE, FA (PRENATAL 1 + 1) 27-1 MG TABS Take 1 tablet by mouth daily.      Historical Provider, MD   BP 114/79  Pulse 69  Temp(Src) 98.5 F (36.9 C) (Oral)  Resp 20  SpO2 100%  LMP 03/14/2014 Physical Exam  Constitutional: She is oriented to person, place, and time. She appears well-developed and well-nourished. No distress.  HENT:  Head: Normocephalic and atraumatic.  Eyes: EOM are normal. Pupils are equal, round, and reactive to light.  Neck: Normal range of motion.  Cardiovascular: Normal rate, normal heart sounds and intact distal pulses.   No murmur heard. Pulmonary/Chest: Effort normal. No respiratory distress.  Abdominal: Soft. Bowel sounds are normal. She exhibits no distension and no mass. There is no tenderness. There is no rebound and no guarding.  No pain at mcburny's point and negative murphy;s sign  Musculoskeletal: Normal range of motion. She exhibits no edema and no tenderness.  Neurological: She is alert and oriented to person, place, and time.  Skin: Skin is warm. No rash noted.  Psychiatric: She has a normal mood and affect. Her behavior is normal. Judgment and thought content normal.    ED Course  Procedures (including critical care time) Labs Review Labs Reviewed - No data to display  Imaging Review No results found.   MDM   1. Viral gastroenteritis    Fluids, rest, electolytes disussed. Disease spread prevention discussed. Pt given Rx for Zofran.   Shelly Flattenavid Nakaila Freeze,  MD Family Medicine PGY-3 03/14/2014, 2:02 PM      Ozella Rocksavid J Elpidio Thielen, MD 03/14/14 [phone]

## 2014-03-14 NOTE — ED Provider Notes (Signed)
Medical screening examination/treatment/procedure(s) were performed by a resident physician and as supervising physician I was immediately available for consultation/collaboration.  Leslee Homeavid Siah Kannan, M.D.  Reuben Likesavid C Elizabethanne Lusher, MD 03/14/14 2151

## 2014-03-14 NOTE — Discharge Instructions (Signed)
You have viral gastroenteritis. This will take anywherer from 24hrs to 10 days to clear Stay well hydrated. Take tylenol and ibuprofen for fever.  Eat bland foods until your appetite returns  Gastritis, Adult Gastritis is soreness and swelling (inflammation) of the lining of the stomach. Gastritis can develop as a sudden onset (acute) or long-term (chronic) condition. If gastritis is not treated, it can lead to stomach bleeding and ulcers. CAUSES  Gastritis occurs when the stomach lining is weak or damaged. Digestive juices from the stomach then inflame the weakened stomach lining. The stomach lining may be weak or damaged due to viral or bacterial infections. One common bacterial infection is the Helicobacter pylori infection. Gastritis can also result from excessive alcohol consumption, taking certain medicines, or having too much acid in the stomach.  SYMPTOMS  In some cases, there are no symptoms. When symptoms are present, they may include:  Pain or a burning sensation in the upper abdomen.  Nausea.  Vomiting.  An uncomfortable feeling of fullness after eating. DIAGNOSIS  Your caregiver may suspect you have gastritis based on your symptoms and a physical exam. To determine the cause of your gastritis, your caregiver may perform the following:  Blood or stool tests to check for the H pylori bacterium.  Gastroscopy. A thin, flexible tube (endoscope) is passed down the esophagus and into the stomach. The endoscope has a light and camera on the end. Your caregiver uses the endoscope to view the inside of the stomach.  Taking a tissue sample (biopsy) from the stomach to examine under a microscope. TREATMENT  Depending on the cause of your gastritis, medicines may be prescribed. If you have a bacterial infection, such as an H pylori infection, antibiotics may be given. If your gastritis is caused by too much acid in the stomach, H2 blockers or antacids may be given. Your caregiver may  recommend that you stop taking aspirin, ibuprofen, or other nonsteroidal anti-inflammatory drugs (NSAIDs). HOME CARE INSTRUCTIONS  Only take over-the-counter or prescription medicines as directed by your caregiver.  If you were given antibiotic medicines, take them as directed. Finish them even if you start to feel better.  Drink enough fluids to keep your urine clear or pale yellow.  Avoid foods and drinks that make your symptoms worse, such as:  Caffeine or alcoholic drinks.  Chocolate.  Peppermint or mint flavorings.  Garlic and onions.  Spicy foods.  Citrus fruits, such as oranges, lemons, or limes.  Tomato-based foods such as sauce, chili, salsa, and pizza.  Fried and fatty foods.  Eat small, frequent meals instead of large meals. SEEK IMMEDIATE MEDICAL CARE IF:   You have black or dark red stools.  You vomit blood or material that looks like coffee grounds.  You are unable to keep fluids down.  Your abdominal pain gets worse.  You have a fever.  You do not feel better after 1 week.  You have any other questions or concerns. MAKE SURE YOU:  Understand these instructions.  Will watch your condition.  Will get help right away if you are not doing well or get worse. Document Released: 09/13/2001 Document Revised: 03/20/2012 Document Reviewed: 11/02/2011 [facility] Patient Information 2014 [place], [place].

## 2014-08-21 ENCOUNTER — Emergency Department (INDEPENDENT_AMBULATORY_CARE_PROVIDER_SITE_OTHER): Payer: Medicaid Other

## 2014-08-21 ENCOUNTER — Emergency Department (INDEPENDENT_AMBULATORY_CARE_PROVIDER_SITE_OTHER)
Admission: EM | Admit: 2014-08-21 | Discharge: 2014-08-21 | Disposition: A | Discharge status: Home / Self Care | Payer: Medicaid Other | Source: Home / Self Care | Attending: Emergency Medicine | Admitting: Emergency Medicine

## 2014-08-21 ENCOUNTER — Encounter (HOSPITAL_COMMUNITY): Payer: Self-pay

## 2014-08-21 DIAGNOSIS — M722 Plantar fascial fibromatosis: Secondary | ICD-10-CM | POA: Diagnosis not present

## 2014-08-21 MED ORDER — MELOXICAM 15 MG PO TABS: 15.0000 mg | ORAL_TABLET | Freq: Every day | ORAL | Status: DC

## 2014-08-21 MED ORDER — TRAMADOL HCL 50 MG PO TABS: 50.0000 mg | ORAL_TABLET | Freq: Four times a day (QID) | ORAL | Status: DC | PRN

## 2014-08-21 NOTE — Discharge Instructions (Signed)
Plantar Fasciitis (Heel Spur Syndrome) with Rehab The plantar fascia is a fibrous, ligament-like, soft-tissue structure that spans the bottom of the foot. Plantar fasciitis is a condition that causes pain in the foot due to inflammation of the tissue. SYMPTOMS   Pain and tenderness on the underneath side of the foot.  Pain that worsens with standing or walking. CAUSES  Plantar fasciitis is caused by irritation and injury to the plantar fascia on the underneath side of the foot. Common mechanisms of injury include:  Direct trauma to bottom of the foot.  Damage to a small nerve that runs under the foot where the main fascia attaches to the heel bone.  Stress placed on the plantar fascia due to bone spurs. RISK INCREASES WITH:   Activities that place stress on the plantar fascia (running, jumping, pivoting, or cutting).  Poor strength and flexibility.  Improperly fitted shoes.  Tight calf muscles.  Flat feet.  Failure to warm-up properly before activity.  Obesity. PREVENTION  Warm up and stretch properly before activity.  Allow for adequate recovery between workouts.  Maintain physical fitness:  Strength, flexibility, and endurance.  Cardiovascular fitness.  Maintain a health body weight.  Avoid stress on the plantar fascia.  Wear properly fitted shoes, including arch supports for individuals who have flat feet. PROGNOSIS  If treated properly, then the symptoms of plantar fasciitis usually resolve without surgery. However, occasionally surgery is necessary. RELATED COMPLICATIONS   Recurrent symptoms that may result in a chronic condition.  Problems of the lower back that are caused by compensating for the injury, such as limping.  Pain or weakness of the foot during push-off following surgery.  Chronic inflammation, scarring, and partial or complete fascia tear, occurring more often from repeated injections. TREATMENT  Treatment initially involves the use of  ice and medication to help reduce pain and inflammation. The use of strengthening and stretching exercises may help reduce pain with activity, especially stretches of the Achilles tendon. These exercises may be performed at home or with a therapist. Your caregiver may recommend that you use heel cups of arch supports to help reduce stress on the plantar fascia. Occasionally, corticosteroid injections are given to reduce inflammation. If symptoms persist for greater than 6 months despite non-surgical (conservative), then surgery may be recommended.  MEDICATION   If pain medication is necessary, then nonsteroidal anti-inflammatory medications, such as aspirin and ibuprofen, or other minor pain relievers, such as acetaminophen, are often recommended.  Do not take pain medication within 7 days before surgery.  Prescription pain relievers may be given if deemed necessary by your caregiver. Use only as directed and only as much as you need.  Corticosteroid injections may be given by your caregiver. These injections should be reserved for the most serious cases, because they may only be given a certain number of times. HEAT AND COLD  Cold treatment (icing) relieves pain and reduces inflammation. Cold treatment should be applied for 10 to 15 minutes every 2 to 3 hours for inflammation and pain and immediately after any activity that aggravates your symptoms. Use ice packs or massage the area with a piece of ice (ice massage).  Heat treatment may be used prior to performing the stretching and strengthening activities prescribed by your caregiver, physical therapist, or athletic trainer. Use a heat pack or soak the injury in warm water. SEEK IMMEDIATE MEDICAL CARE IF:  Treatment seems to offer no benefit, or the condition worsens.  Any medications produce adverse side effects. EXERCISES RANGE   OF MOTION (ROM) AND STRETCHING EXERCISES - Plantar Fasciitis (Heel Spur Syndrome) These exercises may help you  when beginning to rehabilitate your injury. Your symptoms may resolve with or without further involvement from your physician, physical therapist or athletic trainer. While completing these exercises, remember:   Restoring tissue flexibility helps normal motion to return to the joints. This allows healthier, less painful movement and activity.  An effective stretch should be held for at least 30 seconds.  A stretch should never be painful. You should only feel a gentle lengthening or release in the stretched tissue. RANGE OF MOTION - Toe Extension, Flexion  Sit with your right / left leg crossed over your opposite knee.  Grasp your toes and gently pull them back toward the top of your foot. You should feel a stretch on the bottom of your toes and/or foot.  Hold this stretch for __________ seconds.  Now, gently pull your toes toward the bottom of your foot. You should feel a stretch on the top of your toes and or foot.  Hold this stretch for __________ seconds. Repeat __________ times. Complete this stretch __________ times per day.  RANGE OF MOTION - Ankle Dorsiflexion, Active Assisted  Remove shoes and sit on a chair that is preferably not on a carpeted surface.  Place right / left foot under knee. Extend your opposite leg for support.  Keeping your heel down, slide your right / left foot back toward the chair until you feel a stretch at your ankle or calf. If you do not feel a stretch, slide your bottom forward to the edge of the chair, while still keeping your heel down.  Hold this stretch for __________ seconds. Repeat __________ times. Complete this stretch __________ times per day.  STRETCH - Gastroc, Standing  Place hands on wall.  Extend right / left leg, keeping the front knee somewhat bent.  Slightly point your toes inward on your back foot.  Keeping your right / left heel on the floor and your knee straight, shift your weight toward the wall, not allowing your back to  arch.  You should feel a gentle stretch in the right / left calf. Hold this position for __________ seconds. Repeat __________ times. Complete this stretch __________ times per day. STRETCH - Soleus, Standing  Place hands on wall.  Extend right / left leg, keeping the other knee somewhat bent.  Slightly point your toes inward on your back foot.  Keep your right / left heel on the floor, bend your back knee, and slightly shift your weight over the back leg so that you feel a gentle stretch deep in your back calf.  Hold this position for __________ seconds. Repeat __________ times. Complete this stretch __________ times per day. STRETCH - Gastrocsoleus, Standing  Note: This exercise can place a lot of stress on your foot and ankle. Please complete this exercise only if specifically instructed by your caregiver.   Place the ball of your right / left foot on a step, keeping your other foot firmly on the same step.  Hold on to the wall or a rail for balance.  Slowly lift your other foot, allowing your body weight to press your heel down over the edge of the step.  You should feel a stretch in your right / left calf.  Hold this position for __________ seconds.  Repeat this exercise with a slight bend in your right / left knee. Repeat __________ times. Complete this stretch __________ times per day.    STRENGTHENING EXERCISES - Plantar Fasciitis (Heel Spur Syndrome)  These exercises may help you when beginning to rehabilitate your injury. They may resolve your symptoms with or without further involvement from your physician, physical therapist or athletic trainer. While completing these exercises, remember:   Muscles can gain both the endurance and the strength needed for everyday activities through controlled exercises.  Complete these exercises as instructed by your physician, physical therapist or athletic trainer. Progress the resistance and repetitions only as guided. STRENGTH -  Towel Curls  Sit in a chair positioned on a non-carpeted surface.  Place your foot on a towel, keeping your heel on the floor.  Pull the towel toward your heel by only curling your toes. Keep your heel on the floor.  If instructed by your physician, physical therapist or athletic trainer, add ____________________ at the end of the towel. Repeat __________ times. Complete this exercise __________ times per day. STRENGTH - Ankle Inversion  Secure one end of a rubber exercise band/tubing to a fixed object (table, pole). Loop the other end around your foot just before your toes.  Place your fists between your knees. This will focus your strengthening at your ankle.  Slowly, pull your big toe up and in, making sure the band/tubing is positioned to resist the entire motion.  Hold this position for __________ seconds.  Have your muscles resist the band/tubing as it slowly pulls your foot back to the starting position. Repeat __________ times. Complete this exercises __________ times per day.  Document Released: 09/19/2005 Document Revised: 12/12/2011 Document Reviewed: 01/01/2009 [employer] Patient Information 2015 [employer]. This information is not intended to replace advice given to you by your health care provider. Make sure you discuss any questions you have with your health care provider.  

## 2014-08-21 NOTE — ED Notes (Signed)
Patient states her right foot has been hurting the past four days Patient states she can not recall injuring the foot The foot hurts from the heal to the center of her foot Patient has some mild swelling

## 2014-08-21 NOTE — ED Provider Notes (Signed)
CSN: [id number]     Arrival date & time 08/21/14  1918 History   None    Chief Complaint  Patient presents with  . Foot Pain    right foot   (Consider location/radiation/quality/duration/timing/severity/associated sxs/prior Treatment) HPI            43 year old female presents for evaluation of right foot pain. This started Monday morning when she woke up. She had no pain on Sunday, but when she got out of bed on Monday she had severe pain through the bottom of her foot. This pain is worse with any ambulation and is worse when she first gets up in the morning. The pain feels like it shoots from her heel across the bottom of her foot to her toes. It is described as very tight. She denies any specific injury. No swelling. No history of DVT or PE. No numbness. She has never had this before.  Past Medical History  Diagnosis Date  . Hyperlipidemia     no meds  . Bronchitis   . Headache(784.0)   . Anxiety     no meds  . Depression     no meds  . Sickle cell trait    Past Surgical History  Procedure Laterality Date  . Svd       x 4  . Tonsillectomy    . Wisdom tooth extraction    . Hyperlipidemia      no meds   No family history on file. History  Substance Use Topics  . Smoking status: Former Smoker    Types: Cigars    Quit date: 10/03/2008  . Smokeless tobacco: Never Used  . Alcohol Use: 0.5 oz/week    1 drink(s) per week     Comment: occasionally   OB History    No data available     Review of Systems  Musculoskeletal:       Pain in bottom of right foot, see history of present illness  All other systems reviewed and are negative.   Allergies  Review of patient's allergies indicates no known allergies.  Home Medications   Prior to Admission medications   Medication Sig Start Date End Date Taking? Authorizing Provider  cyclobenzaprine (FLEXERIL) 5 MG tablet Take 1 tablet (5 mg total) by mouth 3 (three) times daily as needed for muscle spasms. 11/05/12   Linna HoffJames D  Kindl, MD  HYDROcodone-acetaminophen (VICODIN ES) 7.5-750 MG per tablet Take 0.5 tablets by mouth every 6 (six) hours as needed. Patient takes for pain     Historical Provider, MD  ibuprofen (ADVIL,MOTRIN) 800 MG tablet Take 1 tablet (800 mg total) by mouth 3 (three) times daily. 11/05/12   Linna HoffJames D Kindl, MD  meloxicam (MOBIC) 15 MG tablet Take 1 tablet (15 mg total) by mouth daily. 08/21/14   Graylon GoodZachary H Devaris Quirk, PA-C  norgestimate-ethinyl estradiol (ORTHO-CYCLEN,SPRINTEC,PREVIFEM) 0.25-35 MG-MCG tablet Take 1 tablet by mouth daily.    Historical Provider, MD  ondansetron (ZOFRAN-ODT) 4 MG disintegrating tablet Take 1 tablet (4 mg total) by mouth every 8 (eight) hours as needed for nausea or vomiting. 03/14/14   Ozella Rocksavid J Merrell, MD  prenatal vitamin w/FE, FA (PRENATAL 1 + 1) 27-1 MG TABS Take 1 tablet by mouth daily.      Historical Provider, MD  traMADol (ULTRAM) 50 MG tablet Take 1 tablet (50 mg total) by mouth every 6 (six) hours as needed. 08/21/14   Adrian BlackwaterZachary H Lourdez Mcgahan, PA-C   BP 124/83 mmHg  Pulse 92  Temp(Src)  98.2 F (36.8 C) (Oral)  Resp 18  SpO2 99%  LMP 07/21/2014 Physical Exam  Constitutional: She is oriented to person, place, and time. Vital signs are normal. She appears well-developed and well-nourished. No distress.  HENT:  Head: Normocephalic and atraumatic.  Pulmonary/Chest: Effort normal. No respiratory distress.  Musculoskeletal:       Right foot: There is tenderness (tenderness to palpation of the plantar fascia). There is normal range of motion, no bony tenderness, no swelling, normal capillary refill and no deformity.  Neurological: She is alert and oriented to person, place, and time. She has normal strength. Coordination normal.  Skin: Skin is warm and dry. No rash noted. She is not diaphoretic.  Psychiatric: She has a normal mood and affect. Judgment normal.  Nursing note and vitals reviewed.   ED Course  Procedures (including critical care time) Labs Review Labs  Reviewed - No data to display  Imaging Review Dg Foot Complete Right  08/21/2014   CLINICAL DATA:  Heel pain for 3 days  EXAM: RIGHT FOOT COMPLETE - 3+ VIEW  COMPARISON:  None  FINDINGS: Osseous mineralization normal.  Joint spaces preserved.  No acute fracture, dislocation or bone destruction.  Plantar and Achilles insertion calcaneal spur formation.  No radiopaque foreign bodies.  IMPRESSION: Calcaneal spurring.   Electronically Signed   By: Ulyses SouthwardMark  Boles M.D.   On: 08/21/2014 20:11     MDM   1. Plantar fasciitis, right    Her exam and x-ray both supported diagnosis of plantar fasciitis. Treated with meloxicam, and when necessary tramadol for a few days. She was instructed in stretching exercises. She will follow-up with podiatry if no improvement   Meds ordered this encounter  Medications  . meloxicam (MOBIC) 15 MG tablet    Sig: Take 1 tablet (15 mg total) by mouth daily.    Dispense:  30 tablet    Refill:  2    Order Specific Question:  Supervising Provider    Answer:  Linna HoffKINDL, JAMES D [phone][[id number]]  . traMADol (ULTRAM) 50 MG tablet    Sig: Take 1 tablet (50 mg total) by mouth every 6 (six) hours as needed.    Dispense:  15 tablet    Refill:  0    Order Specific Question:  Supervising Provider    Answer:  Bradd CanaryKINDL, JAMES D [[id number]]       Graylon GoodZachary H Briannon Boggio, PA-C 08/21/14 2024

## 2014-11-14 ENCOUNTER — Emergency Department (INDEPENDENT_AMBULATORY_CARE_PROVIDER_SITE_OTHER)
Admission: EM | Admit: 2014-11-14 | Discharge: 2014-11-14 | Disposition: A | Discharge status: Home / Self Care | Payer: Medicaid Other | Source: Home / Self Care | Attending: Family Medicine | Admitting: Family Medicine

## 2014-11-14 ENCOUNTER — Encounter (HOSPITAL_COMMUNITY): Payer: Self-pay | Admitting: *Deleted

## 2014-11-14 DIAGNOSIS — W19XXXA Unspecified fall, initial encounter: Secondary | ICD-10-CM

## 2014-11-14 DIAGNOSIS — R52 Pain, unspecified: Secondary | ICD-10-CM | POA: Diagnosis not present

## 2014-11-14 NOTE — ED Notes (Signed)
Pt  Fell   2  Days  Ago  And  Injured her    r   Arm /  Shoulder      She  Did  Not  Hit  Her head  She is  Awake  And  Alert           She  Reports  Feeling   Somewhat  Lightheaded

## 2014-11-14 NOTE — Discharge Instructions (Signed)
Ice and advil and see your doctor if further problems.

## 2014-11-14 NOTE — ED Provider Notes (Signed)
CSN: [id number]     Arrival date & time 11/14/14  1019 History   First MD Initiated Contact with Patient 11/14/14 1048     Chief Complaint  Patient presents with  . Fall   (Consider location/radiation/quality/duration/timing/severity/associated sxs/prior Treatment) Patient is a 44 y.o. female presenting with fall. The history is provided by the patient.  Fall This is a new problem. The current episode started 2 days ago (reports no loc, just walking along on [place] property and fell without knowing how or why., no visble trauma.). The problem has been gradually improving. Pertinent negatives include no chest pain, no abdominal pain, no headaches and no shortness of breath. Associated symptoms comments: oreness on right arm and lower leg and right upper back. No visible trauma..    Past Medical History  Diagnosis Date  . Hyperlipidemia     no meds  . Bronchitis   . Headache(784.0)   . Anxiety     no meds  . Depression     no meds  . Sickle cell trait    Past Surgical History  Procedure Laterality Date  . Svd       x 4  . Tonsillectomy    . Wisdom tooth extraction    . Hyperlipidemia      no meds   History reviewed. No pertinent family history. History  Substance Use Topics  . Smoking status: Former Smoker    Types: Cigars    Quit date: 10/03/2008  . Smokeless tobacco: Never Used  . Alcohol Use: 0.5 oz/week    1 drink(s) per week     Comment: occasionally   OB History    No data available     Review of Systems  Constitutional: Negative.   Respiratory: Negative for shortness of breath.   Cardiovascular: Negative.  Negative for chest pain.  Gastrointestinal: Negative.  Negative for abdominal pain.  Musculoskeletal: Negative.   Skin: Negative.  Negative for wound.  Neurological: Negative.  Negative for headaches.    Allergies  Review of patient's allergies indicates no known allergies.  Home Medications   Prior to Admission medications   Medication Sig Start  Date End Date Taking? Authorizing Provider  cyclobenzaprine (FLEXERIL) 5 MG tablet Take 1 tablet (5 mg total) by mouth 3 (three) times daily as needed for muscle spasms. 11/05/12   Linna HoffJames D Kindl, MD  HYDROcodone-acetaminophen (VICODIN ES) 7.5-750 MG per tablet Take 0.5 tablets by mouth every 6 (six) hours as needed. Patient takes for pain     Historical Provider, MD  ibuprofen (ADVIL,MOTRIN) 800 MG tablet Take 1 tablet (800 mg total) by mouth 3 (three) times daily. 11/05/12   Linna HoffJames D Kindl, MD  meloxicam (MOBIC) 15 MG tablet Take 1 tablet (15 mg total) by mouth daily. 08/21/14   Graylon GoodZachary H Baker, PA-C  norgestimate-ethinyl estradiol (ORTHO-CYCLEN,SPRINTEC,PREVIFEM) 0.25-35 MG-MCG tablet Take 1 tablet by mouth daily.    Historical Provider, MD  ondansetron (ZOFRAN-ODT) 4 MG disintegrating tablet Take 1 tablet (4 mg total) by mouth every 8 (eight) hours as needed for nausea or vomiting. 03/14/14   Ozella Rocksavid J Merrell, MD  prenatal vitamin w/FE, FA (PRENATAL 1 + 1) 27-1 MG TABS Take 1 tablet by mouth daily.      Historical Provider, MD  traMADol (ULTRAM) 50 MG tablet Take 1 tablet (50 mg total) by mouth every 6 (six) hours as needed. 08/21/14   Adrian BlackwaterZachary H Baker, PA-C   BP 137/94 mmHg  Pulse 86  Temp(Src) 98.5 F (36.9 C) (  Oral)  Resp 18  SpO2 100%  LMP 10/28/2014 Physical Exam  Constitutional: She is oriented to person, place, and time. She appears well-developed and well-nourished. No distress.  HENT:  Head: Normocephalic and atraumatic.  Right Ear: External ear normal.  Mouth/Throat: Oropharynx is clear and moist.  Neck: Normal range of motion. Neck supple.  Cardiovascular: Normal heart sounds.   Pulmonary/Chest: Breath sounds normal.  Abdominal: There is no tenderness.  Musculoskeletal: Normal range of motion. She exhibits tenderness.  No visible trauma to areas of complaints, pt in nad, only mult c/o.   Neurological: She is alert and oriented to person, place, and time.  Skin: Skin is warm and  dry.  Nursing note and vitals reviewed.   ED Course  Procedures (including critical care time) Labs Review Labs Reviewed - No data to display  Imaging Review No results found.   MDM   1. Fall, initial encounter        Linna Hoff, MD 11/14/14 [id number]

## 2015-04-24 ENCOUNTER — Emergency Department (HOSPITAL_COMMUNITY)
Admission: EM | Admit: 2015-04-24 | Discharge: 2015-04-24 | Disposition: A | Discharge status: Home / Self Care | Payer: Medicaid Other | Source: Home / Self Care | Attending: Family Medicine | Admitting: Family Medicine

## 2015-04-24 ENCOUNTER — Encounter (HOSPITAL_COMMUNITY): Payer: Self-pay | Admitting: Emergency Medicine

## 2015-04-24 DIAGNOSIS — L03114 Cellulitis of left upper limb: Secondary | ICD-10-CM

## 2015-04-24 MED ORDER — FLUCONAZOLE 150 MG PO TABS: 150.0000 mg | ORAL_TABLET | Freq: Every day | ORAL | Status: DC

## 2015-04-24 MED ORDER — IBUPROFEN 800 MG PO TABS
800.0000 mg | ORAL_TABLET | Freq: Once | ORAL | Status: AC
  Administered 2015-04-24: 800 mg via ORAL

## 2015-04-24 MED ORDER — IBUPROFEN 800 MG PO TABS
ORAL_TABLET | ORAL | Status: AC
  Filled 2015-04-24: qty 1

## 2015-04-24 MED ORDER — CEPHALEXIN 500 MG PO CAPS: 500.0000 mg | ORAL_CAPSULE | Freq: Three times a day (TID) | ORAL | Status: DC

## 2015-04-24 NOTE — ED Provider Notes (Signed)
CSN: [id number]     Arrival date & time 04/24/15  1515 History   First MD Initiated Contact with Patient 04/24/15 1541     Chief Complaint  Patient presents with  . Rash   (Consider location/radiation/quality/duration/timing/severity/associated sxs/prior Treatment) HPI  L forearm rash. Patient states that she woke around 3 AM this morning due to severe acute onset left forearm pain. She noted a small red area and states that she thinks something bit her though she was unable to identify any causative animal. No puncture wounds noted. Patient states that over the course of the day the lesion has progressively gotten larger more red and more tender. Denies purulent discharge, fevers, nausea, vomiting, chest pain, shortness of breath, palpitations     Past Medical History  Diagnosis Date  . Hyperlipidemia     no meds  . Bronchitis   . Headache(784.0)   . Anxiety     no meds  . Depression     no meds  . Sickle cell trait    Past Surgical History  Procedure Laterality Date  . Svd       x 4  . Tonsillectomy    . Wisdom tooth extraction    . Hyperlipidemia      no meds   No family history on file. History  Substance Use Topics  . Smoking status: Former Smoker    Types: Cigars    Quit date: 10/03/2008  . Smokeless tobacco: Never Used  . Alcohol Use: 0.5 oz/week    1 drink(s) per week     Comment: occasionally   OB History    No data available     Review of Systems Per HPI with all other pertinent systems negative.   Allergies  Review of patient's allergies indicates no known allergies.  Home Medications   Prior to Admission medications   Medication Sig Start Date End Date Taking? Authorizing Provider  cephALEXin (KEFLEX) 500 MG capsule Take 1 capsule (500 mg total) by mouth 3 (three) times daily. 04/24/15   Ozella Rocks, MD  cyclobenzaprine (FLEXERIL) 5 MG tablet Take 1 tablet (5 mg total) by mouth 3 (three) times daily as needed for muscle spasms. 11/05/12    Linna Hoff, MD  fluconazole (DIFLUCAN) 150 MG tablet Take 1 tablet (150 mg total) by mouth daily. Repeat dose in 3 days 04/24/15   Ozella Rocks, MD  HYDROcodone-acetaminophen (VICODIN ES) 7.5-750 MG per tablet Take 0.5 tablets by mouth every 6 (six) hours as needed. Patient takes for pain     Historical Provider, MD  ibuprofen (ADVIL,MOTRIN) 800 MG tablet Take 1 tablet (800 mg total) by mouth 3 (three) times daily. 11/05/12   Linna Hoff, MD  meloxicam (MOBIC) 15 MG tablet Take 1 tablet (15 mg total) by mouth daily. 08/21/14   Graylon Good, PA-C  norgestimate-ethinyl estradiol (ORTHO-CYCLEN,SPRINTEC,PREVIFEM) 0.25-35 MG-MCG tablet Take 1 tablet by mouth daily.    Historical Provider, MD  ondansetron (ZOFRAN-ODT) 4 MG disintegrating tablet Take 1 tablet (4 mg total) by mouth every 8 (eight) hours as needed for nausea or vomiting. 03/14/14   Ozella Rocks, MD  prenatal vitamin w/FE, FA (PRENATAL 1 + 1) 27-1 MG TABS Take 1 tablet by mouth daily.      Historical Provider, MD  traMADol (ULTRAM) 50 MG tablet Take 1 tablet (50 mg total) by mouth every 6 (six) hours as needed. 08/21/14   Adrian Blackwater Baker, PA-C   BP 117/69 mmHg  Pulse  84  Temp(Src) 98.3 F (36.8 C) (Oral)  Resp 20  SpO2 99% Physical Exam Physical Exam  Constitutional: oriented to person, place, and time. appears well-developed and well-nourished. No distress.  HENT:  Head: Normocephalic and atraumatic.  Eyes: EOMI. PERRL.  Neck: Normal range of motion.  Cardiovascular: RRR, no m/r/g, 2+ distal pulses,  Pulmonary/Chest: Effort normal and breath sounds normal. No respiratory distress.  Abdominal: Soft. Bowel sounds are normal. NonTTP, no distension.  Musculoskeletal: Normal range of motion. Non ttp, no effusion.  Neurological: alert and oriented to person, place, and time.  Skin: Ventral surface of left distal forearm with patchy area of erythema and mild induration with proximal and lateral tracking. No evidence of bite  wound or other skin abrasion. Tender to palpation.  Psychiatric: normal mood and affect. behavior is normal. Judgment and thought content normal.   ED Course  Procedures (including critical care time) Labs Review Labs Reviewed - No data to display  Imaging Review No results found.   MDM   1. Cellulitis of arm, left    Patient likely awoke this morning due to the pain from the cellulitis as opposed to a true insect or other animal bite. Start Keflex. Motrin 800 mg provided in clinic for pain relief. Patient start Diflucan if she develops yeast infection.    Ozella Rocks, MD 04/24/15 [phone]

## 2015-04-24 NOTE — Discharge Instructions (Signed)
You have developed a skin infection called cellulitis This should be easily treated with antibiotics  Please take ibuprofen for additional pain relief. Please use the Diflucan if you develop a yeast infection.

## 2015-04-24 NOTE — ED Notes (Signed)
Reports she was stung by an unknown insect last night on left forearm Sx include swelling, redness and pain; 8/10 Alert, no signs of acute distress.

## 2016-02-24 IMAGING — CR DG FOOT COMPLETE 3+V*R*
3 series · 3 of 3 positions shown · non-contrast
Comparison: None

CLINICAL DATA: Heel pain for 3 days

EXAM:
RIGHT FOOT COMPLETE - 3+ VIEW

[foot ap]
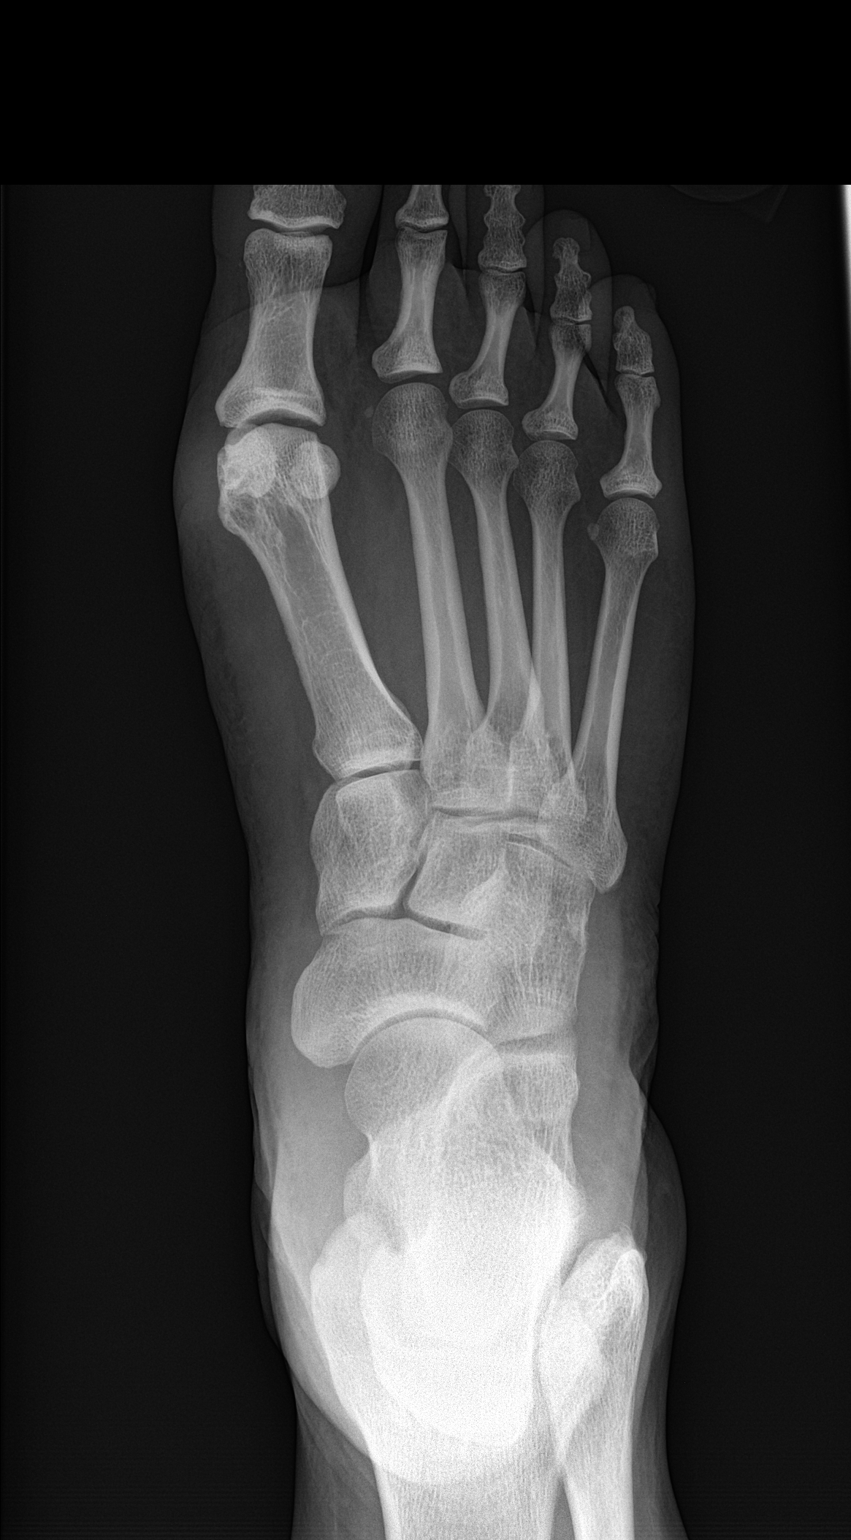

[foot obl]
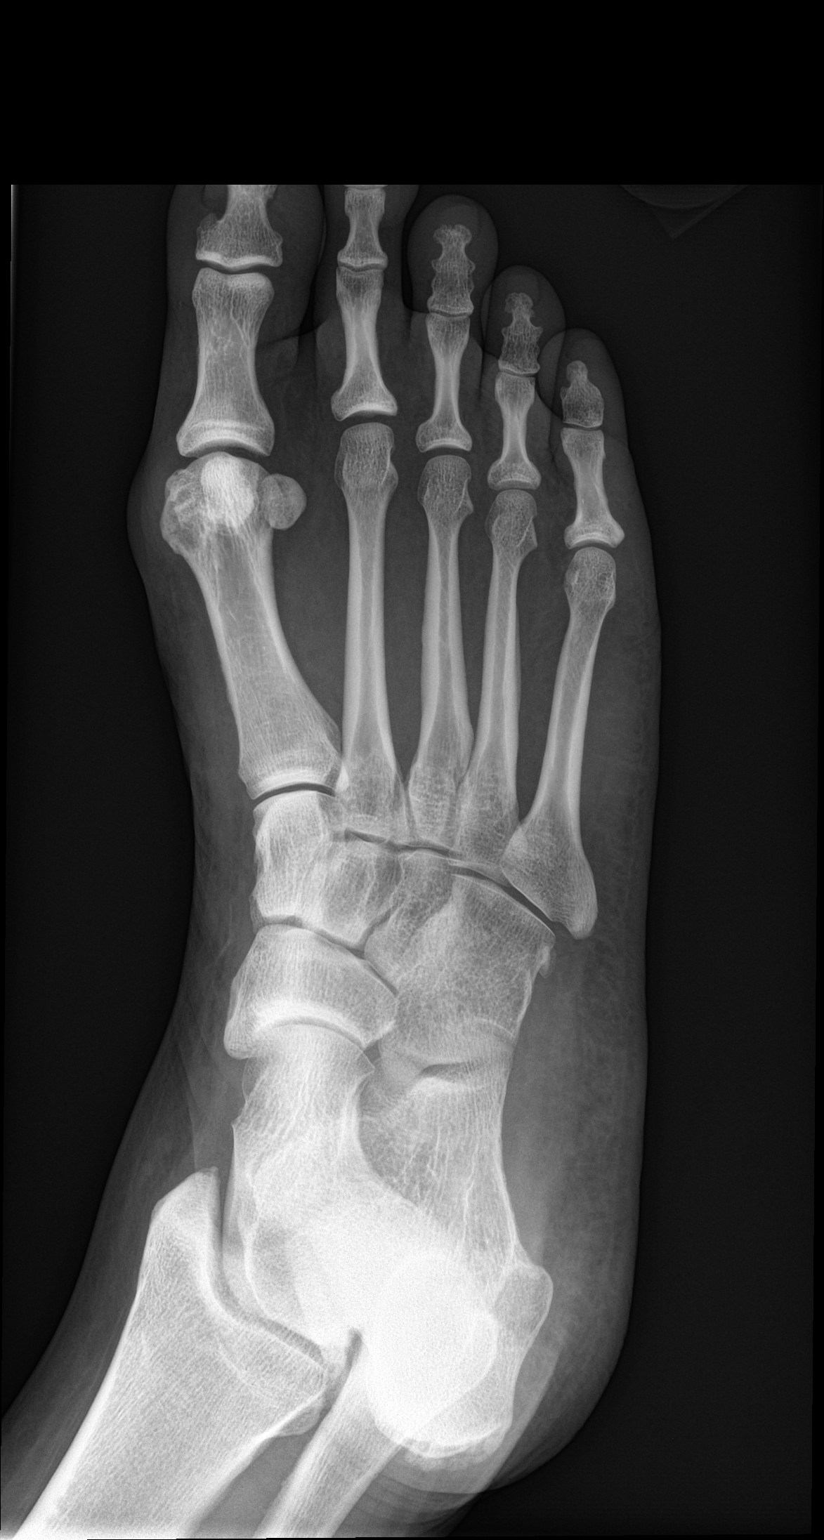

[foot lat]
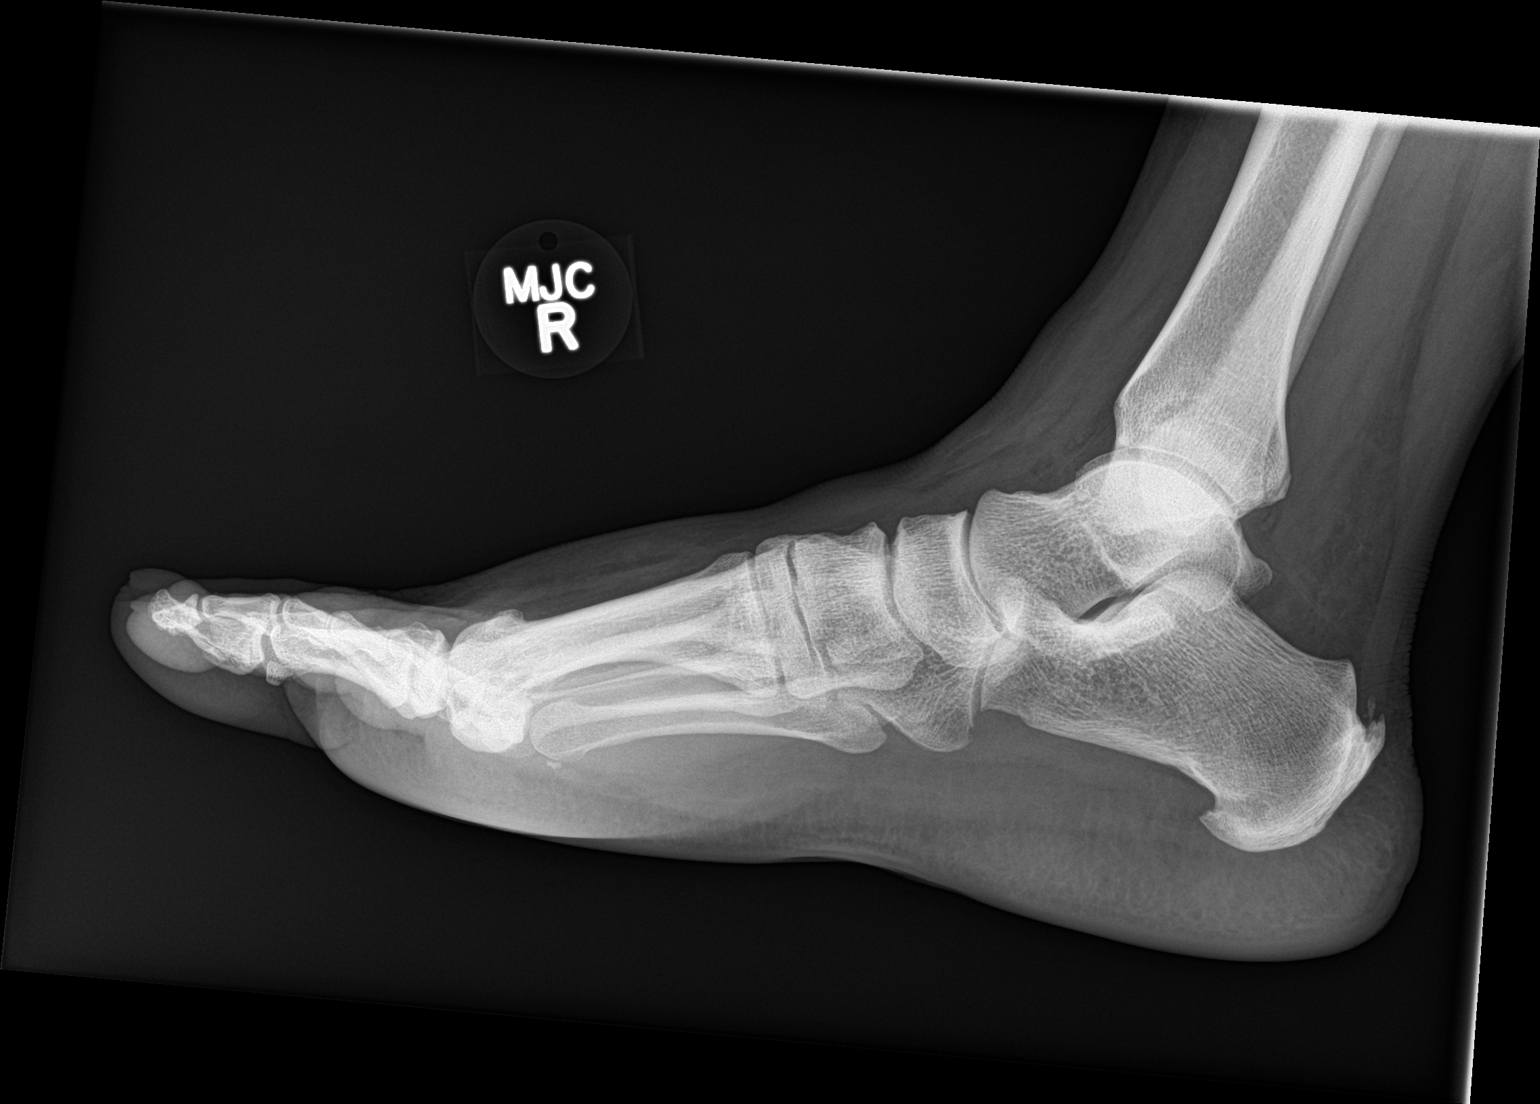

[3 of 3 positions shown; findings below may reference images not displayed]

FINDINGS: Osseous mineralization normal.

Joint spaces preserved.

No acute fracture, dislocation or bone destruction.

Plantar and Achilles insertion calcaneal spur formation.

No radiopaque foreign bodies.
IMPRESSION: Calcaneal spurring.

## 2016-08-08 ENCOUNTER — Emergency Department (HOSPITAL_COMMUNITY)
Admission: EM | Admit: 2016-08-08 | Discharge: 2016-08-08 | Disposition: A | Discharge status: Home / Self Care | Payer: Medicaid Other | Attending: Emergency Medicine | Admitting: Emergency Medicine

## 2016-08-08 ENCOUNTER — Encounter (HOSPITAL_COMMUNITY): Payer: Self-pay | Admitting: Emergency Medicine

## 2016-08-08 DIAGNOSIS — Z87891 Personal history of nicotine dependence: Secondary | ICD-10-CM | POA: Insufficient documentation

## 2016-08-08 DIAGNOSIS — J069 Acute upper respiratory infection, unspecified: Secondary | ICD-10-CM

## 2016-08-08 DIAGNOSIS — Z79899 Other long term (current) drug therapy: Secondary | ICD-10-CM | POA: Diagnosis not present

## 2016-08-08 DIAGNOSIS — R05 Cough: Secondary | ICD-10-CM | POA: Diagnosis present

## 2016-08-08 LAB — RAPID STREP SCREEN (MED CTR MEBANE ONLY): STREPTOCOCCUS, GROUP A SCREEN (DIRECT): NEGATIVE

## 2016-08-08 MED ORDER — ONDANSETRON 4 MG PO TBDP
4.0000 mg | ORAL_TABLET | Freq: Three times a day (TID) | ORAL | 0 refills | Status: DC | PRN
Start: 2016-08-08 — End: 2016-11-28

## 2016-08-08 MED ORDER — BENZONATATE 100 MG PO CAPS: 100.0000 mg | ORAL_CAPSULE | Freq: Three times a day (TID) | ORAL | 0 refills | Status: DC

## 2016-08-08 MED ORDER — NAPROXEN 500 MG PO TABS: 500.0000 mg | ORAL_TABLET | Freq: Two times a day (BID) | ORAL | 0 refills | Status: DC

## 2016-08-08 NOTE — Discharge Instructions (Signed)
Your symptoms are consistent with a viral illness. Viruses do not require antibiotics. Treatment is symptomatic care and it is important to note that these symptoms may last for 7-10 days. Drink plenty of fluids and get plenty of rest. You should be drinking at least a one half to one liter of water an hour to stay hydrated. Ibuprofen, Naproxen, or Tylenol for pain or fever. Zofran for nausea. Tessalon for cough. Plain Mucinex may help relieve congestion. Warm liquids or Chloraseptic spray may help soothe a sore throat.

## 2016-08-08 NOTE — ED Notes (Signed)
Called pt's name 3X back to triage, no one answered.

## 2016-08-08 NOTE — ED Notes (Signed)
Pt states she has right ear pain, nasal congestion and drainage, and sore throat. Denies fever.

## 2016-08-08 NOTE — ED Triage Notes (Addendum)
PT reports body aches, throat pain, R sided neck pain and stiffness; congestion, coughing. States been sweating. No difficulty with air way or breathing. Started Friday.

## 2016-08-08 NOTE — ED Provider Notes (Signed)
[facility]-EMERGENCY DEPT Provider Note    By signing my name below, I, Shelia Harrison, attest that this documentation has been prepared under the direction and in the presence of Junius Faucett, PA-C. Electronically Signed: Earmon PhoenixJennifer Harrison, ED Scribe. 08/08/16. 12:41 PM.    History   Chief Complaint Chief Complaint  Patient presents with  . Influenza    HPI  HPI Comments:  Shelia Harrison is a 45 y.o. female who presents to the Emergency Department complaining of flu like symptoms that began three days ago. She reports associated left ear otalgia, generalized body aches, nonproductive cough, rhinorrhea and sore throat. She reports a HA that started since being here in the ED. Headache is generalized, moderate, and throbbing. She has used an OTC throat spray but has not taken anything else for her symptoms. She denies modifying factors. She denies nausea, vomiting, abdominal pain, fever, chills, neck stiffness, or any other complaints.    Past Medical History:  Diagnosis Date  . Anxiety    no meds  . Bronchitis   . Depression    no meds  . Headache(784.0)   . Hyperlipidemia    no meds  . Sickle cell trait [facility](HCC)     Patient Active Problem List   Diagnosis Date Noted  . Contraceptive management 07/08/2011    Past Surgical History:  Procedure Laterality Date  . hyperlipidemia     no meds  . svd      x 4  . TONSILLECTOMY    . WISDOM TOOTH EXTRACTION      OB History    No data available       Home Medications    Prior to Admission medications   Medication Sig Start Date End Date Taking? Authorizing Provider  benzonatate (TESSALON) 100 MG capsule Take 1 capsule (100 mg total) by mouth every 8 (eight) hours. 08/08/16   Ferris Fielden C Linzy Darling, PA-C  cephALEXin (KEFLEX) 500 MG capsule Take 1 capsule (500 mg total) by mouth 3 (three) times daily. 04/24/15   Ozella Rocksavid J Merrell, MD  cyclobenzaprine (FLEXERIL) 5 MG tablet Take 1 tablet (5 mg total) by mouth 3 (three) times daily as needed  for muscle spasms. 11/05/12   Linna HoffJames D Kindl, MD  fluconazole (DIFLUCAN) 150 MG tablet Take 1 tablet (150 mg total) by mouth daily. Repeat dose in 3 days 04/24/15   Ozella Rocksavid J Merrell, MD  HYDROcodone-acetaminophen (VICODIN ES) 7.5-750 MG per tablet Take 0.5 tablets by mouth every 6 (six) hours as needed. Patient takes for pain     Historical Provider, MD  ibuprofen (ADVIL,MOTRIN) 800 MG tablet Take 1 tablet (800 mg total) by mouth 3 (three) times daily. 11/05/12   Linna HoffJames D Kindl, MD  meloxicam (MOBIC) 15 MG tablet Take 1 tablet (15 mg total) by mouth daily. 08/21/14   Graylon GoodZachary H Baker, PA-C  naproxen (NAPROSYN) 500 MG tablet Take 1 tablet (500 mg total) by mouth 2 (two) times daily. 08/08/16   Wilsie Kern C Alexus Michael, PA-C  norgestimate-ethinyl estradiol (ORTHO-CYCLEN,SPRINTEC,PREVIFEM) 0.25-35 MG-MCG tablet Take 1 tablet by mouth daily.    Historical Provider, MD  ondansetron (ZOFRAN ODT) 4 MG disintegrating tablet Take 1 tablet (4 mg total) by mouth every 8 (eight) hours as needed for nausea or vomiting. 08/08/16   Rakin Lemelle C Tyrece Vanterpool, PA-C  ondansetron (ZOFRAN-ODT) 4 MG disintegrating tablet Take 1 tablet (4 mg total) by mouth every 8 (eight) hours as needed for nausea or vomiting. 03/14/14   Ozella Rocksavid J Merrell, MD  prenatal vitamin w/FE, FA (  PRENATAL 1 + 1) 27-1 MG TABS Take 1 tablet by mouth daily.      Historical Provider, MD  traMADol (ULTRAM) 50 MG tablet Take 1 tablet (50 mg total) by mouth every 6 (six) hours as needed. 08/21/14   Graylon GoodZachary H Baker, PA-C    Family History History reviewed. No pertinent family history.  Social History Social History  Substance Use Topics  . Smoking status: Former Smoker    Types: Cigars    Quit date: 10/03/2008  . Smokeless tobacco: Never Used  . Alcohol use 0.5 oz/week    1 drink(s) per week     Comment: occasionally     Allergies   Patient has no known allergies.   Review of Systems Review of Systems  Constitutional: Negative for chills and fever.  HENT: Positive for ear  pain, rhinorrhea, sneezing and sore throat.   Respiratory: Positive for cough.   Gastrointestinal: Negative for abdominal pain, nausea and vomiting.  Musculoskeletal: Positive for myalgias.  Neurological: Positive for headaches.  All other systems reviewed and are negative.    Physical Exam Updated Vital Signs BP 116/78 (BP Location: Right Arm)   Pulse 91   Temp 98.4 F (36.9 C) (Oral)   Resp 20   LMP 07/04/2016   SpO2 99%   Physical Exam  Constitutional: She appears well-developed and well-nourished. No distress.  HENT:  Head: Normocephalic and atraumatic.  Right Ear: Tympanic membrane and external ear normal.  Left Ear: Tympanic membrane and external ear normal.  Nose: Nose normal.  Mouth/Throat: Uvula is midline, oropharynx is clear and moist and mucous membranes are normal.  Eyes: Conjunctivae are normal.  Neck: Neck supple.  Cardiovascular: Normal rate, regular rhythm, normal heart sounds and intact distal pulses.   Pulmonary/Chest: Effort normal and breath sounds normal. No respiratory distress.  Abdominal: Soft. There is no tenderness. There is no guarding.  Musculoskeletal: She exhibits no edema.  Lymphadenopathy:    She has no cervical adenopathy.  Neurological: She is alert.  Skin: Skin is warm and dry. She is not diaphoretic.  Psychiatric: She has a normal mood and affect. Her behavior is normal.  Nursing note and vitals reviewed.    ED Treatments / Results  DIAGNOSTIC STUDIES: Oxygen Saturation is 99% on RA, normal by my interpretation.   COORDINATION OF CARE: 12:38 PM- Advised pt to take OTC Ibuprofen and Mucinex for her symptoms. Informed her that she needs to increase her fluid intake. Pt verbalizes understanding and agrees to plan.  Medications - No data to display  Labs (all labs ordered are listed, but only abnormal results are displayed) Labs Reviewed  RAPID STREP SCREEN (NOT AT [facility])  CULTURE, GROUP A STREP [facility])    EKG  EKG  Interpretation None       Radiology No results found.  Procedures Procedures (including critical care time)  Medications Ordered in ED Medications - No data to display   Initial Impression / Assessment and Plan / ED Course  I have reviewed the triage vital signs and the nursing notes.  Pertinent labs & imaging results that were available during my care of the patient were reviewed by me and considered in my medical decision making (see chart for details).  Clinical Course     Patient presents with upper respiratory infection symptoms for the last 3 days. Suspect viral syndrome. Patient is nontoxic appearing and without vital sign abnormalities. The patient was given instructions for home care as well as return precautions. Patient voices  understanding of these instructions, accepts the plan, and is comfortable with discharge.   Vitals:   08/08/16 1155 08/08/16 1257  BP: 116/78 112/77  Pulse: 91 90  Resp: 20 20  Temp: 98.4 F (36.9 C) 98.5 F (36.9 C)  TempSrc: Oral Oral  SpO2: 99% 99%     I personally performed the services described in this documentation, which was scribed in my presence. The recorded information has been reviewed and is accurate.  Final Clinical Impressions(s) / ED Diagnoses   Final diagnoses:  Upper respiratory tract infection, unspecified type    New Prescriptions Discharge Medication List as of 08/08/2016 12:42 PM    START taking these medications   Details  benzonatate (TESSALON) 100 MG capsule Take 1 capsule (100 mg total) by mouth every 8 (eight) hours., Starting Mon 08/08/2016, Print    naproxen (NAPROSYN) 500 MG tablet Take 1 tablet (500 mg total) by mouth 2 (two) times daily., Starting Mon 08/08/2016, Print    !! ondansetron (ZOFRAN ODT) 4 MG disintegrating tablet Take 1 tablet (4 mg total) by mouth every 8 (eight) hours as needed for nausea or vomiting., Starting Mon 08/08/2016, Print     !! - Potential duplicate medications found.  Please discuss with provider.       Anselm Pancoast, PA-C 08/09/16 1712    Rolland Porter, MD 08/30/16 1524

## 2016-08-10 LAB — CULTURE, GROUP A STREP (THRC)

## 2016-11-28 ENCOUNTER — Encounter (HOSPITAL_COMMUNITY): Payer: Self-pay | Admitting: Family Medicine

## 2016-11-28 ENCOUNTER — Ambulatory Visit (HOSPITAL_COMMUNITY)
Admission: EM | Admit: 2016-11-28 | Discharge: 2016-11-28 | Disposition: A | Discharge status: Home / Self Care | Payer: Medicaid Other | Attending: Family Medicine | Admitting: Family Medicine

## 2016-11-28 DIAGNOSIS — H109 Unspecified conjunctivitis: Secondary | ICD-10-CM | POA: Diagnosis not present

## 2016-11-28 MED ORDER — POLYMYXIN B-TRIMETHOPRIM 10000-0.1 UNIT/ML-% OP SOLN: 1.0000 [drp] | OPHTHALMIC | 0 refills | Status: DC

## 2016-11-28 NOTE — Discharge Instructions (Signed)
Use the drops as prescribed. Also obtain a bottle of Zaditor eyedrops and use one drop in the left eye twice a day. This will help with irritation and redness and inflammation. Also use warm compresses 2-3 times a day on the left eye, particular in the morning.

## 2016-11-28 NOTE — ED Triage Notes (Signed)
Pt here for redness to left eye. Pt wears contacts.

## 2016-11-28 NOTE — ED Provider Notes (Signed)
CSN: [id number]     Arrival date & time 11/28/16  1139 History   First MD Initiated Contact with Patient 11/28/16 1347     Chief Complaint  Patient presents with  . Eye Problem   (Consider location/radiation/quality/duration/timing/severity/associated sxs/prior Treatment) 46 year old female complaining of irritation of the left eye since chest today. She states it is watery but denies drainage. She states she may have a little bit of a vision blurriness when looking across the room. She was wearing contacts she has them out now.      Past Medical History:  Diagnosis Date  . Anxiety    no meds  . Bronchitis   . Depression    no meds  . Headache(784.0)   . Hyperlipidemia    no meds  . Sickle cell trait (HCC)    Past Surgical History:  Procedure Laterality Date  . hyperlipidemia     no meds  . svd      x 4  . TONSILLECTOMY    . WISDOM TOOTH EXTRACTION     History reviewed. No pertinent family history. Social History  Substance Use Topics  . Smoking status: Former Smoker    Types: Cigars    Quit date: 10/03/2008  . Smokeless tobacco: Never Used  . Alcohol use 0.5 oz/week    1 drink(s) per week     Comment: occasionally   OB History    No data available     Review of Systems  Constitutional: Negative.   HENT: Negative.   Eyes: Positive for redness and itching. Negative for photophobia and discharge.  Respiratory: Negative.   Neurological: Negative.   All other systems reviewed and are negative.   Allergies  Patient has no known allergies.  Home Medications   Prior to Admission medications   Medication Sig Start Date End Date Taking? Authorizing Provider  trimethoprim-polymyxin b (POLYTRIM) ophthalmic solution Place 1 drop into the left eye every 4 (four) hours. 11/28/16   Hayden Rasmussen, NP   Meds Ordered and Administered this Visit  Medications - No data to display  BP 117/71   Pulse 71   Temp 98.5 F (36.9 C)   Resp 18   LMP 11/28/2016   SpO2 100%   No data found.   Physical Exam  Constitutional: She is oriented to person, place, and time. She appears well-developed and well-nourished.  HENT:  Head: Normocephalic and atraumatic.  Eyes: EOM are normal. Pupils are equal, round, and reactive to light.  Minor erythema to the left lower conjunctiva. Minor injection to the sclera. Anterior chamber is clear. No foreign body seen under magnification.  Neck: Normal range of motion. Neck supple.  Cardiovascular: Normal rate.   Pulmonary/Chest: Effort normal.  Neurological: She is alert and oriented to person, place, and time.  Skin: Skin is warm and dry.  Psychiatric: She has a normal mood and affect.  Nursing note and vitals reviewed.   Urgent Care Course     Procedures (including critical care time)  Labs Review Labs Reviewed - No data to display  Imaging Review No results found.   Visual Acuity Review  Right Eye Distance:   Left Eye Distance:   Bilateral Distance:    Right Eye Near:   Left Eye Near:    Bilateral Near:         MDM   1. Conjunctivitis of left eye, unspecified conjunctivitis type    Use the drops as prescribed. Also obtain a bottle of Zaditor eyedrops and use  one drop in the left eye twice a day. This will help with irritation and redness and inflammation. Also use warm compresses 2-3 times a day on the left eye, particular in the morning. Meds ordered this encounter  Medications  . trimethoprim-polymyxin b (POLYTRIM) ophthalmic solution    Sig: Place 1 drop into the left eye every 4 (four) hours.    Dispense:  10 mL    Refill:  0    Order Specific Question:   Supervising Provider    Answer:   Linna HoffKINDL, JAMES D [[id number]]       Hayden Rasmussenavid Jadien Lehigh, NP 11/28/16 [phone]

## 2016-12-23 ENCOUNTER — Encounter (HOSPITAL_COMMUNITY): Payer: Self-pay | Admitting: Family Medicine

## 2016-12-23 ENCOUNTER — Ambulatory Visit (HOSPITAL_COMMUNITY)
Admission: EM | Admit: 2016-12-23 | Discharge: 2016-12-23 | Disposition: A | Discharge status: Home / Self Care | Payer: Medicaid Other | Attending: Internal Medicine | Admitting: Internal Medicine

## 2016-12-23 DIAGNOSIS — S0093XA Contusion of unspecified part of head, initial encounter: Secondary | ICD-10-CM | POA: Diagnosis not present

## 2016-12-23 NOTE — ED Triage Notes (Addendum)
Pt here for pain to head after bumping it on a shelf yesterday. Denies LOC. Knot to head.

## 2016-12-23 NOTE — Discharge Instructions (Signed)
You likely have a small bruise on your head. No worry for any serious injury. Can use Tylenol or ibuprofen as needed for pain I would also consider icing the area.

## 2016-12-23 NOTE — ED Provider Notes (Signed)
CSN: [id number]     Arrival date & time 12/23/16  1302 History   None    Chief Complaint  Patient presents with  . Headache   (Consider location/radiation/quality/duration/timing/severity/associated sxs/prior Treatment) SDA for head contusion  Patient hit her head on a shelf yesterday. She notes no loss of consciousness, no headache, no nausea, no vomiting no blurry vision. She is just worried as she feels a little tender at the spot where she hit her head today and wants to be checked out. She has not used any Tylenol or ibuprofen for the pain. She did not ice the area afterwards. She did use some topical Neosporin on the area.       Past Medical History:  Diagnosis Date  . Anxiety    no meds  . Bronchitis   . Depression    no meds  . Headache(784.0)   . Hyperlipidemia    no meds  . Sickle cell trait (HCC)    Past Surgical History:  Procedure Laterality Date  . hyperlipidemia     no meds  . svd      x 4  . TONSILLECTOMY    . WISDOM TOOTH EXTRACTION     History reviewed. No pertinent family history. Social History  Substance Use Topics  . Smoking status: Former Smoker    Types: Cigars    Quit date: 10/03/2008  . Smokeless tobacco: Never Used  . Alcohol use 0.5 oz/week    1 drink(s) per week     Comment: occasionally   OB History    No data available     Review of Systems  Constitutional: Negative for chills and fever.  Eyes: Negative for photophobia and visual disturbance.  Gastrointestinal: Negative for nausea and vomiting.  Neurological: Negative for dizziness, facial asymmetry and headaches.    Allergies  Patient has no known allergies.  Home Medications   Prior to Admission medications   Not on File   Meds Ordered and Administered this Visit  Medications - No data to display  BP 123/79   Pulse 93   Temp 98.4 F (36.9 C)   Resp 18   LMP 11/28/2016   SpO2 100%  No data found.   Physical Exam  Constitutional: She is oriented to person,  place, and time. She appears well-developed.  HENT:  Head: Normocephalic and atraumatic.  Right Ear: External ear normal.  Left Ear: External ear normal.  Nose: Nose normal.  Mouth/Throat: Oropharynx is clear and moist.  Eyes: Conjunctivae are normal. Pupils are equal, round, and reactive to light.  Neck: Normal range of motion. Neck supple.  Neurological: She is alert and oriented to person, place, and time. No sensory deficit. She exhibits normal muscle tone. Coordination normal.  Skin: Skin is warm.  Slightly tender on right forehead     Urgent Care Course     Procedures (including critical care time)  Labs Review Labs Reviewed - No data to display  Imaging Review No results found.    MDM   1. Contusion of head, unspecified part of head, initial encounter    No concern for neurological deficiency, very mild contusion following trauma. No loss of consciousness, no nausea, vomiting, no neurological deficiencies, no headache  Supportive care provided Follow-up as needed if symptoms change    [place] Mayra ReelZahra Dandre Sisler, MD 12/23/16 1431

## 2017-04-05 ENCOUNTER — Encounter (HOSPITAL_COMMUNITY): Payer: Self-pay | Admitting: Emergency Medicine

## 2017-04-05 ENCOUNTER — Ambulatory Visit (HOSPITAL_COMMUNITY)
Admission: EM | Admit: 2017-04-05 | Discharge: 2017-04-05 | Disposition: A | Discharge status: Home / Self Care | Payer: Medicaid Other | Attending: Family Medicine | Admitting: Family Medicine

## 2017-04-05 DIAGNOSIS — R739 Hyperglycemia, unspecified: Secondary | ICD-10-CM

## 2017-04-05 DIAGNOSIS — S91302A Unspecified open wound, left foot, initial encounter: Secondary | ICD-10-CM

## 2017-04-05 LAB — GLUCOSE, CAPILLARY: Glucose-Capillary: 147 mg/dL — ABNORMAL HIGH (ref 65–99)

## 2017-04-05 NOTE — ED Triage Notes (Signed)
[facility] a [facility] of a wound on the top of her left foot x 2 weeks.

## 2017-04-05 NOTE — ED Provider Notes (Signed)
  [facility]   [id number] 04/05/17 Arrival Time: 1351  ASSESSMENT & PLAN:  Today you were diagnosed with the following: 1. Open wound of left foot, initial encounter   2. Hyperglycemia (elevated blood sugar)   Your non-fasting blood sugar today was 147.  I recommend scheduling a follow up visit with your primary care physician for further evaluation of possible diabetes.  You have not been prescribed prescription medications this visit  Please see instructions regarding wound care given today.  Reviewed expectations re: course of current medical issues. Questions answered. Outlined signs and symptoms indicating need for more acute intervention. Patient verbalized understanding. After Visit Summary given.   SUBJECTIVE:  Shelia Harrison is a 46 y.o. female who presents with complaint of left foot wound for the past 2 weeks. Not healing well. No injury reported. Gradual onset. Some discomfort. No drainage. Afebrile. No self treatment. Has been wearing flip flops. No h/o poorly healing wounds.  No h/o DM. Does report increased thirst and more frequent urination lately.  ROS: As per HPI.   OBJECTIVE:  Vitals:   04/05/17 1402  BP: 123/82  Pulse: (!) 107  Resp: 18  Temp: 98.6 F (37 C)  TempSrc: Oral  SpO2: 98%    General appearance: alert;  no distress Extremities: extremities normal, atraumatic, no cyanosis or edema MSK: symmetrical with no gross deformities; left foot with FROM Skin: left dorsal distal open wound/early ulceration approx 1cm in size; no surrounding erythema; no drainage; slightly tender Neurologic: normal gait  Results for orders placed or performed during the hospital encounter of 04/05/17  Glucose, capillary  Result Value Ref Range   Glucose-Capillary 147 (H) 65 - 99 mg/dL    No Known Allergies  PMHx, SurgHx, SocialHx, Medications, and Allergies were reviewed in the Visit Navigator and updated as appropriate.       Mardella LaymanHagler,  Ahmaad Neidhardt, MD 04/05/17 [phone]

## 2017-04-05 NOTE — Discharge Instructions (Addendum)
Today you were diagnosed with the following: 1. Open wound of left foot, initial encounter   2. Hyperglycemia (elevated blood sugar)    Your non-fasting blood sugar today was 147.   I recommend scheduling a follow up visit with your primary care physician for further evaluation of possible diabetes.   You have not been prescribed prescription medications this visit   Please see instructions regarding wound care given today.

## 2017-07-10 ENCOUNTER — Encounter (HOSPITAL_COMMUNITY): Payer: Self-pay | Admitting: Emergency Medicine

## 2017-07-10 ENCOUNTER — Ambulatory Visit (HOSPITAL_COMMUNITY)
Admission: EM | Admit: 2017-07-10 | Discharge: 2017-07-10 | Disposition: A | Discharge status: Home / Self Care | Payer: Medicaid Other | Attending: Internal Medicine | Admitting: Internal Medicine

## 2017-07-10 DIAGNOSIS — L03221 Cellulitis of neck: Secondary | ICD-10-CM

## 2017-07-10 MED ORDER — CEPHALEXIN 500 MG PO CAPS: 500.0000 mg | ORAL_CAPSULE | Freq: Three times a day (TID) | ORAL | 0 refills | Status: AC

## 2017-07-10 NOTE — ED Triage Notes (Signed)
Pt here with insect bite to right shoulder that happened yesterday

## 2017-07-25 LAB — GLUCOSE, POCT (MANUAL RESULT ENTRY): POC Glucose: 161 mg/dl — AB (ref 70–99)

## 2018-02-11 ENCOUNTER — Other Ambulatory Visit: Payer: Self-pay | Admitting: Physician Assistant

## 2018-02-11 ENCOUNTER — Ambulatory Visit (HOSPITAL_COMMUNITY)
Admission: EM | Admit: 2018-02-11 | Discharge: 2018-02-11 | Disposition: A | Discharge status: Home / Self Care | Payer: Medicaid Other | Attending: Physician Assistant | Admitting: Physician Assistant

## 2018-02-11 ENCOUNTER — Other Ambulatory Visit: Payer: Self-pay

## 2018-02-11 ENCOUNTER — Encounter (HOSPITAL_COMMUNITY): Payer: Self-pay | Admitting: *Deleted

## 2018-02-11 DIAGNOSIS — J3489 Other specified disorders of nose and nasal sinuses: Secondary | ICD-10-CM | POA: Diagnosis not present

## 2018-02-11 DIAGNOSIS — R519 Headache, unspecified: Secondary | ICD-10-CM

## 2018-02-11 DIAGNOSIS — R110 Nausea: Secondary | ICD-10-CM | POA: Diagnosis not present

## 2018-02-11 DIAGNOSIS — R51 Headache: Secondary | ICD-10-CM

## 2018-02-11 MED ORDER — ONDANSETRON HCL 4 MG PO TABS: 4.0000 mg | ORAL_TABLET | Freq: Four times a day (QID) | ORAL | 0 refills | Status: AC

## 2018-02-11 MED ORDER — NAPROXEN 500 MG PO TABS: 500.0000 mg | ORAL_TABLET | Freq: Two times a day (BID) | ORAL | 0 refills | Status: AC

## 2018-02-11 MED ORDER — DOXYCYCLINE HYCLATE 100 MG PO CAPS: 100.0000 mg | ORAL_CAPSULE | Freq: Two times a day (BID) | ORAL | 0 refills | Status: AC

## 2018-02-11 NOTE — ED Provider Notes (Signed)
02/11/2018 7:56 PM   DOB: 07-27-1971 / MRN: [id number]  SUBJECTIVE:  Shelia Harrison is a 47 y.o. female presenting for headache.  She tells me the headache is right-sided and has been there for about 4 days now needs to be worsening.  She denies a history of migraines.  She associates nausea with a headache.  She tells me it is tender over her right frontal sinus.  She denies fever.  She has No Known Allergies.   She  has a past medical history of Anxiety, Bronchitis, Depression, Headache(784.0), Hyperlipidemia, and Sickle cell trait (HCC).    She  reports that she quit smoking about 9 years ago. Her smoking use included cigars. She has never used smokeless tobacco. She reports that she drinks alcohol. She reports that she does not use drugs. She  reports that she does not currently engage in sexual activity. The patient  has a past surgical history that includes svd ; Tonsillectomy; and Wisdom tooth extraction.  Her family history includes Cancer in her maternal aunt and maternal grandmother; Healthy in her father and mother; Lupus in her sister.  Review of Systems  Constitutional: Negative for chills.  Respiratory: Negative for hemoptysis.   Gastrointestinal: Negative for blood in stool and melena.  Skin: Negative for rash.  Neurological: Negative for dizziness, tingling, sensory change, speech change and focal weakness.    OBJECTIVE:  BP 117/84   Pulse (!) 111   Temp 99 F (37.2 C) (Oral)   Resp 16   LMP 01/29/2018 (Approximate)   SpO2 96%   Physical Exam  Constitutional: She is oriented to person, place, and time. She appears well-nourished. No distress.  HENT:  Head:    Mouth/Throat: Uvula is midline, oropharynx is clear and moist and mucous membranes are normal.  Eyes: Pupils are equal, round, and reactive to light. EOM are normal.  Cardiovascular: Normal rate.  Rate 94 by auscultation  Pulmonary/Chest: Effort normal and breath sounds normal.  Abdominal: She exhibits  no distension.  Neurological: She is alert and oriented to person, place, and time. She has normal strength. She is not disoriented. No cranial nerve deficit or sensory deficit. She displays a negative Romberg sign. Coordination and gait normal. GCS eye subscore is 4. GCS verbal subscore is 5. GCS motor subscore is 6.  Gait normal.  No strength deficit.  Skin: Skin is dry. She is not diaphoretic.  Psychiatric: She has a normal mood and affect. Her behavior is normal. Her mood appears not anxious. She is not agitated.  Vitals reviewed.   No results found for this or any previous visit (from the past 72 hour(s)).  No results found.  ASSESSMENT AND PLAN:  No orders of the defined types were placed in this encounter.    Tenderness over frontal sinus most likely a sinus infection.  Given she is complaining of headache along with nausea I will treat her with naproxen and Zofran along with doxycycline.  She has appropriate neurological exam here.  Nausea  Acute nonintractable headache, unspecified headache type      The patient is advised to call or return to clinic if she does not see an improvement in symptoms, or to seek the care of the closest emergency department if she worsens with the above plan.   Deliah Boston, MHS, PA-C 02/11/2018 7:56 PM    Ofilia Neas, PA-C 02/11/18 1957

## 2018-02-11 NOTE — Discharge Instructions (Signed)
Please take the medications as prescribed.  Come back if you are not feeling better over the next few days.

## 2018-02-11 NOTE — ED Triage Notes (Signed)
C/O frontal HA with nausea x 3 days.

## 2018-12-03 ENCOUNTER — Ambulatory Visit (HOSPITAL_COMMUNITY)
Admission: EM | Admit: 2018-12-03 | Discharge: 2018-12-03 | Disposition: A | Discharge status: Home / Self Care | Payer: Worker's Compensation | Attending: Internal Medicine | Admitting: Internal Medicine

## 2018-12-03 ENCOUNTER — Encounter (HOSPITAL_COMMUNITY): Payer: Self-pay | Admitting: Emergency Medicine

## 2018-12-03 DIAGNOSIS — M79605 Pain in left leg: Secondary | ICD-10-CM

## 2018-12-03 DIAGNOSIS — W19XXXA Unspecified fall, initial encounter: Secondary | ICD-10-CM

## 2018-12-03 MED ORDER — NAPROXEN 125 MG/5ML PO SUSP: 500.0000 mg | Freq: Two times a day (BID) | ORAL | 0 refills | Status: AC

## 2018-12-03 MED ORDER — CYCLOBENZAPRINE HCL 5 MG PO TABS: 5.0000 mg | ORAL_TABLET | Freq: Two times a day (BID) | ORAL | 0 refills | Status: AC | PRN

## 2018-12-03 NOTE — Discharge Instructions (Addendum)
Please use Naprosyn twice daily You may use flexeril as needed to help with pain. This is a muscle relaxer and causes sedation- please use only at bedtime or when you will be home and not have to drive/work-begin with 1 tablet, may increase to 2 Alternate ice and heat to thigh  Follow-up with occupational health if having persistent issues

## 2018-12-03 NOTE — ED Triage Notes (Signed)
Pt sts slip and fall on 2/17 landing on left side; pt sts left upper leg and lower back pain

## 2018-12-05 NOTE — ED Provider Notes (Signed)
[facility]    CSN: [id number] Arrival date & time: 12/03/18  1640     History   Chief Complaint Chief Complaint  Patient presents with  . Fall    HPI Shelia Harrison is a 48 y.o. female no contributing past medical history presenting today for evaluation of left leg pain after a fall.  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  Believes that there was a wet floor.  Accident happened 2/17 approximately 2 weeks ago.  Since she has had left lower back pain in the left thigh pain.  Most of her pain is in her thigh.  Denies difficulty moving the leg or knee.  She is having difficulty sleeping and getting comfortable is when she puts pressure on this side it triggers the pain.  She has tried Tylenol without relief.  She denies any issues with urination or bowel movements.  Denies numbness or tingling, denies saddle anesthesia.  Denies weakness in legs.  Denies hitting head or loss of consciousness.  HPI  Past Medical History:  Diagnosis Date  . Anxiety    no meds  . Bronchitis   . Depression    no meds  . Headache(784.0)   . Hyperlipidemia    no meds  . Sickle cell trait [facility])     Patient Active Problem List   Diagnosis Date Noted  . Contraceptive management 07/08/2011    Past Surgical History:  Procedure Laterality Date  . svd      x 4  . TONSILLECTOMY    . WISDOM TOOTH EXTRACTION      OB History   No obstetric history on file.      Home Medications    Prior to Admission medications   Medication Sig Start Date End Date Taking? Authorizing Provider  cyclobenzaprine (FLEXERIL) 5 MG tablet Take 1-2 tablets (5-10 mg total) by mouth 2 (two) times daily as needed for muscle spasms. 12/03/18   Wieters, Hallie C, PA-C  hydrochlorothiazide (HYDRODIURIL) 25 MG tablet Take 25 mg by mouth daily.    [provider]  naproxen (NAPROSYN) 125  MG/5ML suspension Take 20 mLs (500 mg total) by mouth 2 (two) times daily with a meal. 12/03/18   Wieters, Hallie C, PA-C  ondansetron (ZOFRAN) 4 MG tablet Take 1 tablet (4 mg total) by mouth every 6 (six) hours. 02/11/18   Ofilia Neas, PA-C    Family History Family History  Problem Relation Age of Onset  . Healthy Mother   . Healthy Father   . Lupus Sister   . Cancer Maternal Grandmother   . Cancer Maternal Aunt     Social History Social History   Tobacco Use  . Smoking status: Former Smoker    Types: Cigars    Last attempt to quit: 10/03/2008    Years since quitting: 10.1  . Smokeless tobacco: Never Used  Substance Use Topics  . Alcohol use: Yes    Comment: occasionally  . Drug use: No     Allergies   Patient has no known allergies.   Review of Systems Review of Systems  Constitutional: Negative for activity change, chills, diaphoresis and fatigue.  HENT: Negative for ear pain, tinnitus and trouble swallowing.   Eyes: Negative for photophobia and visual disturbance.  Respiratory: Negative for cough, chest tightness and shortness of breath.  Cardiovascular: Negative for chest pain and leg swelling.  Gastrointestinal: Negative for abdominal pain, blood in stool, nausea and vomiting.  Musculoskeletal: Positive for back pain, gait problem and myalgias. Negative for arthralgias, neck pain and neck stiffness.  Skin: Negative for color change and wound.  Neurological: Negative for dizziness, weakness, light-headedness, numbness and headaches.     Physical Exam Triage Vital Signs ED Triage Vitals  Enc Vitals Group     BP 12/03/18 1801 125/87     Pulse Rate 12/03/18 1801 80     Resp 12/03/18 1801 16     Temp 12/03/18 1801 98.4 F (36.9 C)     Temp Source 12/03/18 1801 Oral     SpO2 12/03/18 1801 97 %     Weight --      Height --      Head Circumference --      Peak Flow --      Pain Score 12/03/18 1807 8     Pain Loc --      Pain Edu? --      Excl. in GC?  --    No data found.  Updated Vital Signs BP 125/87 (BP Location: Left Arm)   Pulse 80   Temp 98.4 F (36.9 C) (Oral)   Resp 16   SpO2 97%   Visual Acuity Right Eye Distance:   Left Eye Distance:   Bilateral Distance:    Right Eye Near:   Left Eye Near:    Bilateral Near:     Physical Exam Vitals signs and nursing note reviewed.  Constitutional:      General: She is not in acute distress.    Appearance: She is well-developed.  HENT:     Head: Normocephalic and atraumatic.  Eyes:     Extraocular Movements: Extraocular movements intact.     Conjunctiva/sclera: Conjunctivae normal.     Pupils: Pupils are equal, round, and reactive to light.  Neck:     Musculoskeletal: Neck supple.  Cardiovascular:     Rate and Rhythm: Normal rate and regular rhythm.     Heart sounds: No murmur.  Pulmonary:     Effort: Pulmonary effort is normal. No respiratory distress.     Breath sounds: Normal breath sounds.     Comments: Breathing comfortably at rest, CTABL, no wheezing, rales or other adventitious sounds auscultated  Abdominal:     Palpations: Abdomen is soft.     Tenderness: There is no abdominal tenderness.  Musculoskeletal:     Comments: Nontender to palpation of cervical, thoracic and lumbar spine midline, increased tenderness to left lateral musculature, negative straight leg raise bilaterally  Mild tenderness to palpation of left lateral thigh, no overlying bruising or discoloration Full active extension of knee Hip strength 5/5 and equal bilaterally, knee strength 5/5 and equal bilaterally Patellar reflex 2+ bilaterally  Able to ambulate from chair to exam table without assistance and minimal abnormality  Skin:    General: Skin is warm and dry.  Neurological:     Mental Status: She is alert.      UC Treatments / Results  Labs (all labs ordered are listed, but only abnormal results are displayed) Labs Reviewed - No data to display  EKG None  Radiology No  results found.  Procedures Procedures (including critical care time)  Medications Ordered in UC Medications - No data to display  Initial Impression / Assessment and Plan / UC Course  I have reviewed the triage vital signs and the nursing  notes.  Pertinent labs & imaging results that were available during my care of the patient were reviewed by me and considered in my medical decision making (see chart for details).     Patient with left-sided pain secondary to fall, no midline tenderness of lumbar region, do not suspect underlying bony abnormality.  Strength intact distally.  No red flags for cauda equina.  Most likely lumbar strain with either quad strain versus contusion to lateral quad area.  Will recommend treatment with anti-inflammatories and muscle relaxers at this time along with ice.  Would expect gradual improvement over the next 2 weeks, follow-up if symptoms not improving or worsening.Discussed strict return precautions. Patient verbalized understanding and is agreeable with plan.  Final Clinical Impressions(s) / UC Diagnoses   Final diagnoses:  Fall, initial encounter  Leg pain, lateral, left     Discharge Instructions     Please use Naprosyn twice daily You may use flexeril as needed to help with pain. This is a muscle relaxer and causes sedation- please use only at bedtime or when you will be home and not have to drive/work-begin with 1 tablet, may increase to 2 Alternate ice and heat to thigh  Follow-up with occupational health if having persistent issues   ED Prescriptions    Medication Sig Dispense Auth. Provider   naproxen (NAPROSYN) 125 MG/5ML suspension Take 20 mLs (500 mg total) by mouth 2 (two) times daily with a meal. 473 mL Wieters, Hallie C, PA-C   cyclobenzaprine (FLEXERIL) 5 MG tablet Take 1-2 tablets (5-10 mg total) by mouth 2 (two) times daily as needed for muscle spasms. 24 tablet Wieters, [place] C, PA-C     Controlled Substance  Prescriptions [place] Controlled Substance Registry consulted? Not Applicable   Lew DawesWieters, Hallie C, [place]PA-C 12/05/18 [phone]

## 2019-07-10 NOTE — ED Provider Notes (Signed)
[place]    CSN: [id number] Arrival date & time: 07/10/17  1957      History   Chief Complaint Chief Complaint  Patient presents with  . Insect Bite    HPI Shelia Harrison is a 49 y.o. female.   Pt concerned about possible insect bite.      Past Medical History:  Diagnosis Date  . Anxiety    no meds  . Bronchitis   . Depression    no meds  . Headache(784.0)   . Hyperlipidemia    no meds  . Sickle cell trait [facility])     Patient Active Problem List   Diagnosis Date Noted  . Contraceptive management 07/08/2011    Past Surgical History:  Procedure Laterality Date  . svd      x 4  . TONSILLECTOMY    . WISDOM TOOTH EXTRACTION      OB History   No obstetric history on file.      Home Medications    Prior to Admission medications   Medication Sig Start Date End Date Taking? Authorizing Provider  cyclobenzaprine (FLEXERIL) 5 MG tablet Take 1-2 tablets (5-10 mg total) by mouth 2 (two) times daily as needed for muscle spasms. 12/03/18   Wieters, Hallie C, PA-C  hydrochlorothiazide (HYDRODIURIL) 25 MG tablet Take 25 mg by mouth daily.    [provider]  naproxen (NAPROSYN) 125 MG/5ML suspension Take 20 mLs (500 mg total) by mouth 2 (two) times daily with a meal. 12/03/18   Wieters, Hallie C, PA-C  ondansetron (ZOFRAN) 4 MG tablet Take 1 tablet (4 mg total) by mouth every 6 (six) hours. 02/11/18   Tereasa Coop, PA-C    Family History Family History  Problem Relation Age of Onset  . Healthy Mother   . Healthy Father   . Lupus Sister   . Cancer Maternal Grandmother   . Cancer Maternal Aunt     Social History Social History   Tobacco Use  . Smoking status: Former Smoker    Types: Cigars    Quit date: 10/03/2008    Years since quitting: 10.7  . Smokeless tobacco: Never Used  Substance Use Topics  . Alcohol use: Yes    Comment: occasionally  . Drug use: No     Allergies   Patient has no known allergies.   Review of  Systems Review of Systems  Constitutional: Negative for chills and fever.  HENT: Negative for sore throat and tinnitus.   Eyes: Negative for redness.  Respiratory: Negative for cough and shortness of breath.   Cardiovascular: Negative for chest pain and palpitations.  Gastrointestinal: Negative for abdominal pain, diarrhea, nausea and vomiting.  Genitourinary: Negative for dysuria, frequency and urgency.  Musculoskeletal: Negative for myalgias.  Skin: Negative for rash.       No lesions  Neurological: Negative for weakness.  Hematological: Does not bruise/bleed easily.  Psychiatric/Behavioral: Negative for suicidal ideas.     Physical Exam Triage Vital Signs ED Triage Vitals [07/10/17 2005]  Enc Vitals Group     BP 139/85     Pulse Rate (!) 101     Resp 18     Temp 98.3 F (36.8 C)     Temp Source Oral     SpO2 100 %     Weight      Height      Head Circumference      Peak Flow      Pain Score 7  Pain Loc      Pain Edu?      Excl. in GC?    No data found.  Updated Vital Signs BP 139/85 (BP Location: Right Arm)   Pulse (!) 101   Temp 98.3 F (36.8 C) (Oral)   Resp 18   SpO2 100%   Visual Acuity Right Eye Distance:   Left Eye Distance:   Bilateral Distance:    Right Eye Near:   Left Eye Near:    Bilateral Near:     Physical Exam Vitals signs and nursing note reviewed.  Constitutional:      General: She is not in acute distress.    Appearance: She is well-developed.  HENT:     Head: Normocephalic and atraumatic.  Eyes:     General: No scleral icterus.    Conjunctiva/sclera: Conjunctivae normal.     Pupils: Pupils are equal, round, and reactive to light.  Neck:     Musculoskeletal: Normal range of motion and neck supple.     Thyroid: No thyromegaly.     Vascular: No JVD.     Trachea: No tracheal deviation.  Cardiovascular:     Rate and Rhythm: Normal rate and regular rhythm.     Heart sounds: Normal heart sounds. No murmur. No friction rub. No  gallop.   Pulmonary:     Effort: Pulmonary effort is normal.     Breath sounds: Normal breath sounds.  Abdominal:     General: Bowel sounds are normal. There is no distension.     Palpations: Abdomen is soft.     Tenderness: There is no abdominal tenderness.  Musculoskeletal: Normal range of motion.  Lymphadenopathy:     Cervical: No cervical adenopathy.  Skin:    General: Skin is warm and dry.  Neurological:     Mental Status: She is alert and oriented to person, place, and time.     Cranial Nerves: No cranial nerve deficit.  Psychiatric:        Behavior: Behavior normal.        Thought Content: Thought content normal.        Judgment: Judgment normal.      UC Treatments / Results  Labs (all labs ordered are listed, but only abnormal results are displayed) Labs Reviewed - No data to display  EKG   Radiology No results found.  Procedures Procedures (including critical care time)  Medications Ordered in UC Medications - No data to display  Initial Impression / Assessment and Plan / UC Course  I have reviewed the triage vital signs and the nursing notes.  Pertinent labs & imaging results that were available during my care of the patient were reviewed by me and considered in my medical decision making (see chart for details).     Unclear origin of bite but no necrosis.  Cover for cellulitis. No s/s of sepsis. Final Clinical Impressions(s) / UC Diagnoses   Final diagnoses:  Cellulitis of neck   Discharge Instructions   None    ED Prescriptions    Medication Sig Dispense Auth. Provider   cephALEXin (KEFLEX) 500 MG capsule Take 1 capsule (500 mg total) by mouth 3 (three) times daily. 15 capsule Arnaldo Natal, MD     PDMP not reviewed this encounter.   Arnaldo Natal, MD 07/10/19 [phone]

## 2020-05-05 ENCOUNTER — Encounter: Payer: Self-pay | Admitting: *Deleted

## 2020-05-05 NOTE — Congregational Nurse Program (Signed)
  Dept: [phone]   Congregational Nurse Program Note  Date of Encounter: 05/05/2020  Past Medical History: Past Medical History:  Diagnosis Date  . Anxiety    no meds  . Bronchitis   . Depression    no meds  . Headache(784.0)   . Hyperlipidemia    no meds  . Sickle cell trait [facility])     Encounter Details:  CNP Questionnaire - 05/05/20 1805      Questionnaire   Patient Status Not Applicable    Race Black or African American    Location Patient Served At [facility]    Uninsured Not Applicable    Food Yes, have food insecurities    Housing/Utilities Yes, have permanent housing    Transportation No transportation needs    Interpersonal Safety Yes, feel physically and emotionally safe where you currently live    Medication No medication insecurities    Medical Provider Yes    Referrals Not Applicable    ED Visit Averted Not Applicable    Life-Saving Intervention Made Not Applicable          Client came to [facility] clinic today for BP check.  She is aware that her BP is elevated.  She tells me that she has an ache in her left breast that she is worried about.  She says she has an ultrasound scheduled for 06/11/20 to check her breast.  We discussed the importance of low sodium diet, healthy eating and weight management for better BP control.  Client encouraged to return to clinic for recheck next week and to see her PCP for a BP check as well.  Roderic Palau, RN, MSN, CNP [phone] Office [phone] Cell

## 2020-06-12 ENCOUNTER — Other Ambulatory Visit: Payer: Self-pay | Admitting: Family Medicine

## 2020-06-12 DIAGNOSIS — N644 Mastodynia: Secondary | ICD-10-CM

## 2022-09-12 DIAGNOSIS — Z1152 Encounter for screening for COVID-19: Secondary | ICD-10-CM | POA: Diagnosis not present

## 2022-10-01 DIAGNOSIS — Z1152 Encounter for screening for COVID-19: Secondary | ICD-10-CM | POA: Diagnosis not present

## 2022-11-15 DIAGNOSIS — K649 Unspecified hemorrhoids: Secondary | ICD-10-CM | POA: Diagnosis not present

## 2022-11-15 DIAGNOSIS — I10 Essential (primary) hypertension: Secondary | ICD-10-CM | POA: Diagnosis not present
